# Patient Record
Sex: Male | Born: 1950
Health system: Southern US, Community
[De-identification: ages and names within clinical notes are randomized; demographics above are authoritative.]

## PROBLEM LIST (undated history)

## (undated) DIAGNOSIS — K5790 Diverticulosis of intestine, part unspecified, without perforation or abscess without bleeding: Secondary | ICD-10-CM

## (undated) DIAGNOSIS — R9439 Abnormal result of other cardiovascular function study: Secondary | ICD-10-CM

## (undated) DIAGNOSIS — R0789 Other chest pain: Secondary | ICD-10-CM

## (undated) DIAGNOSIS — E669 Obesity, unspecified: Secondary | ICD-10-CM

## (undated) DIAGNOSIS — I10 Essential (primary) hypertension: Secondary | ICD-10-CM

## (undated) DIAGNOSIS — K219 Gastro-esophageal reflux disease without esophagitis: Secondary | ICD-10-CM

## (undated) DIAGNOSIS — E785 Hyperlipidemia, unspecified: Secondary | ICD-10-CM

## (undated) DIAGNOSIS — I4510 Unspecified right bundle-branch block: Secondary | ICD-10-CM

## (undated) HISTORY — PX: PERCUTANEOUS PINNING PHALANX FRACTURE OF HAND: SUR1027

## (undated) HISTORY — DX: Other chest pain: R07.89

## (undated) HISTORY — PX: GALLBLADDER SURGERY: SHX652

## (undated) HISTORY — DX: Abnormal result of other cardiovascular function study: R94.39

## (undated) HISTORY — DX: Gastro-esophageal reflux disease without esophagitis: K21.9

## (undated) HISTORY — DX: Obesity, unspecified: E66.9

## (undated) HISTORY — DX: Hyperlipidemia, unspecified: E78.5

## (undated) HISTORY — DX: Diverticulosis of intestine, part unspecified, without perforation or abscess without bleeding: K57.90

## (undated) HISTORY — DX: Essential (primary) hypertension: I10

## (undated) HISTORY — DX: Unspecified right bundle-branch block: I45.10

---

## 1997-06-13 ENCOUNTER — Ambulatory Visit (HOSPITAL_COMMUNITY): Admission: RE | Admit: 1997-06-13 | Discharge: 1997-06-13 | Payer: Self-pay | Admitting: *Deleted

## 2001-06-26 ENCOUNTER — Emergency Department (HOSPITAL_COMMUNITY): Admission: EM | Admit: 2001-06-26 | Discharge: 2001-06-26 | Payer: Self-pay | Admitting: Emergency Medicine

## 2001-12-03 ENCOUNTER — Encounter (INDEPENDENT_AMBULATORY_CARE_PROVIDER_SITE_OTHER): Payer: Self-pay

## 2001-12-03 ENCOUNTER — Ambulatory Visit (HOSPITAL_COMMUNITY): Admission: RE | Admit: 2001-12-03 | Discharge: 2001-12-03 | Payer: Self-pay | Admitting: *Deleted

## 2003-08-28 ENCOUNTER — Ambulatory Visit (HOSPITAL_COMMUNITY): Admission: RE | Admit: 2003-08-28 | Discharge: 2003-08-28 | Payer: Self-pay | Admitting: *Deleted

## 2003-08-28 ENCOUNTER — Encounter (INDEPENDENT_AMBULATORY_CARE_PROVIDER_SITE_OTHER): Payer: Self-pay | Admitting: *Deleted

## 2005-03-11 ENCOUNTER — Emergency Department (HOSPITAL_COMMUNITY): Admission: EM | Admit: 2005-03-11 | Discharge: 2005-03-11 | Payer: Self-pay | Admitting: Emergency Medicine

## 2005-03-17 ENCOUNTER — Ambulatory Visit (HOSPITAL_COMMUNITY): Admission: RE | Admit: 2005-03-17 | Discharge: 2005-03-17 | Payer: Self-pay | Admitting: Urology

## 2007-03-01 ENCOUNTER — Ambulatory Visit (HOSPITAL_COMMUNITY): Admission: RE | Admit: 2007-03-01 | Discharge: 2007-03-01 | Payer: Self-pay | Admitting: *Deleted

## 2007-03-01 HISTORY — PX: COLONOSCOPY: SHX174

## 2008-07-23 ENCOUNTER — Encounter: Admission: RE | Admit: 2008-07-23 | Discharge: 2008-07-23 | Payer: Self-pay | Admitting: Podiatry

## 2008-09-18 ENCOUNTER — Emergency Department (HOSPITAL_COMMUNITY): Admission: EM | Admit: 2008-09-18 | Discharge: 2008-09-19 | Payer: Self-pay | Admitting: Emergency Medicine

## 2008-11-25 ENCOUNTER — Encounter: Admission: RE | Admit: 2008-11-25 | Discharge: 2008-11-25 | Payer: Self-pay | Admitting: Cardiology

## 2008-11-26 ENCOUNTER — Inpatient Hospital Stay (HOSPITAL_BASED_OUTPATIENT_CLINIC_OR_DEPARTMENT_OTHER): Admission: RE | Admit: 2008-11-26 | Discharge: 2008-11-26 | Payer: Self-pay | Admitting: Cardiology

## 2008-11-26 HISTORY — PX: CARDIAC CATHETERIZATION: SHX172

## 2009-06-13 ENCOUNTER — Ambulatory Visit (HOSPITAL_COMMUNITY): Admission: EM | Admit: 2009-06-13 | Discharge: 2009-06-13 | Payer: Self-pay | Admitting: Pediatric Emergency Medicine

## 2010-02-05 ENCOUNTER — Ambulatory Visit (INDEPENDENT_AMBULATORY_CARE_PROVIDER_SITE_OTHER): Payer: BC Managed Care – PPO | Admitting: Cardiology

## 2010-02-05 DIAGNOSIS — I119 Hypertensive heart disease without heart failure: Secondary | ICD-10-CM

## 2010-02-05 DIAGNOSIS — E7800 Pure hypercholesterolemia, unspecified: Secondary | ICD-10-CM

## 2010-02-05 DIAGNOSIS — Z79899 Other long term (current) drug therapy: Secondary | ICD-10-CM

## 2010-03-22 LAB — BASIC METABOLIC PANEL
Calcium: 9.1 mg/dL (ref 8.4–10.5)
Creatinine, Ser: 1.35 mg/dL (ref 0.4–1.5)
GFR calc Af Amer: >60 mL/min (ref >60)
Glucose, Bld: 107 mg/dL — ABNORMAL HIGH (ref 70–99)

## 2010-03-22 LAB — CBC
HCT: 44.3 % (ref 39.0–52.0)
MCV: 87.3 fL (ref 78.0–100.0)
Platelets: 243 10*3/uL (ref 150–400)
RBC: 5.08 MIL/uL (ref 4.22–5.81)
RDW: 13.7 % (ref 11.5–15.5)
WBC: 12.9 10*3/uL — ABNORMAL HIGH (ref 4.0–10.5)

## 2010-03-22 LAB — DIFFERENTIAL
Basophils Absolute: 0.1 10*3/uL (ref 0.0–0.1)
Lymphocytes Relative: 14 % (ref 12–46)
Monocytes Absolute: 0.6 10*3/uL (ref 0.1–1.0)
Monocytes Relative: 5 % (ref 3–12)
Neutrophils Relative %: 79 % — ABNORMAL HIGH (ref 43–77)

## 2010-05-18 NOTE — Op Note (Signed)
James Olson, James Olson             ACCOUNT NO.:  [id number]   MEDICAL RECORD NO.:  [id number]          PATIENT TYPE:  AMB   LOCATION:  ENDO                         FACILITY:  [facility]   PHYSICIAN:  Georgiana Spinner, M.D.    DATE OF BIRTH:  April 16, 1950   DATE OF PROCEDURE:  03/01/2007  DATE OF DISCHARGE:                               OPERATIVE REPORT   PROCEDURE PERFORMED:  Colonoscopy with polypectomy and biopsy.   INDICATIONS:  Colon polyps.   ANESTHESIA:  Fentanyl 50 mcg and Versed 5 mg.   PROCEDURE:  With the patient mildly sedated in the left lateral  decubitus position, rectal examination was performed which was  unremarkable.  The prostate felt normal to my examination.  Subsequently, the Pentax videoscopic colonoscope was inserted in the  rectum and passed under direct vision to the cecum identified by the  ileocecal valve and appendiceal orifice, both of which were  photographed.  One fold removed from the ileocecal valve was a small  polyp that was seen, photographed, and removed using snare cautery  technique.  There was a small residual polypoid tissue then removed  using hot biopsy forceps.  The polyp tissue, however, was lost in the  cecum.  I went and explored the cecum once again, but I could not find  the polypoid tissue, so I elected to withdraw the colonoscope taking  circumferential views of the colonic mucosa stopping only in the rectum  which appeared normal on direct and showed hemorrhoids on retroflexed  view. The endoscope was straightened and withdrawn.  The patient's vital  signs and pulse oximeter remained stable.  The patient tolerated the  procedure well without apparent complications.   FINDINGS:  A polyp in the ascending colon removed.  Await follow up  which will be approximately three years.           ______________________________  Georgiana Spinner, M.D.     GMO/MEDQ  D:  03/01/2007  T:  03/01/2007  Job:  [id number]

## 2010-05-21 NOTE — Op Note (Signed)
NAME:  James Olson, James Olson                       ACCOUNT NO.:  [id number]   MEDICAL RECORD NO.:  [id number]                   PATIENT TYPE:  AMB   LOCATION:  ENDO                                 FACILITY:  [facility]   PHYSICIAN:  Georgiana Spinner, M.D.                 DATE OF BIRTH:  04-Feb-1950   DATE OF PROCEDURE:  08/28/2003  DATE OF DISCHARGE:                                 OPERATIVE REPORT   PROCEDURE PERFORMED:  Upper endoscopy.   ENDOSCOPIST:  Georgiana Spinner, M.D.   INDICATIONS FOR PROCEDURE:  Gastroesophageal reflux disease.   ANESTHESIA:  Demerol 40 mg, Versed 4 mg.   DESCRIPTION OF PROCEDURE:  With the patient mildly sedated in the left  lateral decubitus position, the Olympus video endoscope was inserted in the  mouth and passed under direct vision through the esophagus which appeared  normal.  In the distal esophagus there was a ring and there was one  questionable area of Barrett's.  This was photographed and biopsied.  We  obtained tissue from the area in question and then advanced the endoscope to  the stomach.  The fundus, body, antrum, duodenal bulb and second portion of  the duodenum were unremarkable.  From this point, the endoscope was slowly  withdrawn taking circumferential views of the duodenal mucosa until the  endoscope was pulled back into the stomach and placed on retroflexion to  view the stomach from below.  The endoscope was then straightened and  withdrawn taking circumferential views of the gastric and esophageal mucosa.  The patient's vital signs and pulse oximeter remained stable.  The patient  tolerated the procedure well without apparent complications.   FINDINGS:  Question Barrett's esophagus, biopsied, await biopsy report.   PLAN:  Patient will call me for results and will follow up with me as an  outpatient.                                               Georgiana Spinner, M.D.    GMO/MEDQ  D:  08/28/2003  T:  08/28/2003  Job:  [id number]

## 2010-05-21 NOTE — Op Note (Signed)
   NAMEDENY, James Olson                         ACCOUNT NO.:  [id number]   MEDICAL RECORD NO.:  [id number]                   PATIENT TYPE:  AMB   LOCATION:  ENDO                                 FACILITY:  [facility]   PHYSICIAN:  Georgiana Spinner, M.D.                 DATE OF BIRTH:  Feb 19, 1950   DATE OF PROCEDURE:  DATE OF DISCHARGE:                                 OPERATIVE REPORT   PROCEDURE:  Upper endoscopy.   INDICATIONS:  GERD.   ANESTHESIA:  Demerol 50 mg, Versed 4 mg.   DESCRIPTION OF PROCEDURE:  With the patient mildly sedated in the left  lateral decubitus position, the Olympus videoscopic endoscope was inserted  into the mouth and passed under direct vision through the esophagus which  appeared normal until it reached the distal esophagus and there was evidence  of Barrett's seen, photographed and biopsied.  The endoscope was then  advanced into the stomach, the fundus, body, and antrum appeared normal.  The duodenal bulb showed fairly extensive erythema, it was mild to moderate  but there was edema and redness, we photographed this and then pulled back  from the second portion of the duodenum taking circumferential views of the  oral mucosa until the endoscope was pulled back into the stomach at which  point we did a biopsy for Helicobacter.  The endoscope was placed in  retroflexion to view the stomach from below and the endoscope was then  straightened and withdrawn, taking circumferential views of the remaining  gastric and esophageal mucosa.  The patient's vital signs and pulse oximetry  remained stable and the patient tolerated the procedure well without  apparent complications.   FINDINGS:  1. Barrett's esophagus.  2. Duodenitis, CLO biopsy taken.   PLAN:  Await biopsy reports.  The patient will call me for the results and  follow up with me as an outpatient.                                                Georgiana Spinner, M.D.    GMO/MEDQ  D:  12/03/2001  T:   12/03/2001  Job:  [id number]

## 2010-05-21 NOTE — Op Note (Signed)
   James Olson, James Olson                         ACCOUNT NO.:  [id number]   MEDICAL RECORD NO.:  [id number]                   PATIENT TYPE:  AMB   LOCATION:  ENDO                                 FACILITY:  [facility]   PHYSICIAN:  Georgiana Spinner, M.D.                 DATE OF BIRTH:  13-Dec-1950   DATE OF PROCEDURE:  12/03/2001  DATE OF DISCHARGE:                                 OPERATIVE REPORT   PROCEDURE:  Colonoscopy.   INDICATIONS:  Rectal bleeding, colon polyp.   ANESTHESIA:  Demerol, none further given.  Versed 1 mg.   DESCRIPTION OF PROCEDURE:  With the patient mildly sedated in the left  lateral decubitus position, the Olympus videoscopic colonoscope was inserted  in the rectum and passed under direct vision to the cecum, identified by  ileocecal valve and appendiceal orifice, both of which were photographed.  From this point the endoscope was slowly withdrawn, taking circumferential  views of the entire colonic mucosa, stopping in the descending colon at  about 80 cm from the anal verge, at which point a small polyp was seen,  photographed, and removed using hot biopsy forceps technique, setting of 20-  20 blended current.  We then pulled back all the way to the rectum, which  appeared normal on direct and showed hemorrhoid on retroflexed view.  The  endoscope was straightened and withdrawn.  The patient's vital signs and  pulse oximetry remained stable.  The patient tolerated the procedure well  and without apparent complications.   FINDINGS:  Polyp of descending colon, removed.   PLAN:  Await biopsy report.  The patient will call me for results and follow  up with me as an outpatient.                                               Georgiana Spinner, M.D.    GMO/MEDQ  D:  12/03/2001  T:  12/03/2001  Job:  [id number]

## 2011-03-29 ENCOUNTER — Encounter: Payer: Self-pay | Admitting: *Deleted

## 2012-02-11 ENCOUNTER — Emergency Department (HOSPITAL_COMMUNITY)
Admission: EM | Admit: 2012-02-11 | Discharge: 2012-02-11 | Disposition: A | Discharge status: Home / Self Care | Payer: Federal, State, Local not specified - PPO | Attending: Emergency Medicine | Admitting: Emergency Medicine

## 2012-02-11 ENCOUNTER — Encounter (HOSPITAL_COMMUNITY): Payer: Self-pay | Admitting: *Deleted

## 2012-02-11 DIAGNOSIS — K219 Gastro-esophageal reflux disease without esophagitis: Secondary | ICD-10-CM | POA: Insufficient documentation

## 2012-02-11 DIAGNOSIS — E785 Hyperlipidemia, unspecified: Secondary | ICD-10-CM | POA: Insufficient documentation

## 2012-02-11 DIAGNOSIS — M109 Gout, unspecified: Secondary | ICD-10-CM

## 2012-02-11 DIAGNOSIS — Z8719 Personal history of other diseases of the digestive system: Secondary | ICD-10-CM | POA: Insufficient documentation

## 2012-02-11 DIAGNOSIS — E669 Obesity, unspecified: Secondary | ICD-10-CM | POA: Insufficient documentation

## 2012-02-11 DIAGNOSIS — M79609 Pain in unspecified limb: Secondary | ICD-10-CM | POA: Insufficient documentation

## 2012-02-11 DIAGNOSIS — Z79899 Other long term (current) drug therapy: Secondary | ICD-10-CM | POA: Insufficient documentation

## 2012-02-11 DIAGNOSIS — Z7982 Long term (current) use of aspirin: Secondary | ICD-10-CM | POA: Insufficient documentation

## 2012-02-11 DIAGNOSIS — Z8679 Personal history of other diseases of the circulatory system: Secondary | ICD-10-CM | POA: Insufficient documentation

## 2012-02-11 DIAGNOSIS — I10 Essential (primary) hypertension: Secondary | ICD-10-CM | POA: Insufficient documentation

## 2012-02-11 DIAGNOSIS — M79676 Pain in unspecified toe(s): Secondary | ICD-10-CM

## 2012-02-11 MED ORDER — PREDNISONE 10 MG PO TABS: 20.0000 mg | ORAL_TABLET | Freq: Two times a day (BID) | ORAL | Status: DC

## 2012-02-11 NOTE — ED Provider Notes (Signed)
History     CSN: [id number]  Arrival date & time 02/11/12  1020   First MD Initiated Contact with Patient 02/11/12 1126      Chief Complaint  Patient presents with  . Toe Pain    (Consider location/radiation/quality/duration/timing/severity/associated sxs/prior treatment) Patient is a 62 y.o. male presenting with toe pain. The history is provided by the patient.  Toe Pain This is a new problem. Episode onset: 3 days ago. The problem occurs constantly. The problem has been gradually worsening. The symptoms are aggravated by walking. Nothing relieves the symptoms. He has tried nothing for the symptoms.    Past Medical History  Diagnosis Date  . Hypertension   . Hyperlipidemia   . Obesity   . RBBB   . GERD (gastroesophageal reflux disease)   . Chest pain, exertional   . Abnormal stress test     H/O  . Diverticulosis     H/O    Past Surgical History  Procedure Laterality Date  . Colonoscopy  03/01/07    W/POLYPECTOMY AND BX  . Cardiac catheterization  11/26/08    EF 55%  WITH DR. Deborah Chalk  . Percutaneous pinning phalanx fracture of hand      Family History  Problem Relation Age of Onset  . Arrhythmia Mother     PAF AND HAS PACEMAKER  . Sudden death Father 21    History  Substance Use Topics  . Smoking status: Not on file  . Smokeless tobacco: Not on file  . Alcohol Use: No      Review of Systems  All other systems reviewed and are negative.    Allergies  Ace inhibitors and Benicar  Home Medications   Current Outpatient Rx  Name  Route  Sig  Dispense  Refill  . Ascorbic Acid (VITAMIN C) 100 MG tablet   Oral   Take 100 mg by mouth daily.         Marland Kitchen aspirin EC 81 MG tablet   Oral   Take 81 mg by mouth daily.         . beta carotene w/minerals (OCUVITE) tablet   Oral   Take 1 tablet by mouth daily.         . Omega-3 Fatty Acids (FISH OIL) 1200 MG CAPS   Oral   Take 1 capsule by mouth daily.         . pravastatin (PRAVACHOL) 80 MG  tablet   Oral   Take 80 mg by mouth daily.         . RABEprazole (ACIPHEX) 20 MG tablet   Oral   Take 20 mg by mouth daily.         . Travoprost, BAK Free, (TRAVATAN) 0.004 % SOLN ophthalmic solution   Ophthalmic   Apply 1 drop to eye as directed.         . triamterene-hydrochlorothiazide (MAXZIDE-25) 37.5-25 MG per tablet   Oral   Take 1 tablet by mouth daily.           BP 138/85  Pulse 67  Temp(Src) 98.3 F (36.8 C) (Oral)  Resp 16  SpO2 99%  Physical Exam  Nursing note and vitals reviewed. Constitutional: He is oriented to person, place, and time. He appears well-developed and well-nourished. No distress.  HENT:  Head: Normocephalic and atraumatic.  Neck: Normal range of motion. Neck supple.  Musculoskeletal:  There is mild erythema around the left mtp joint.  There is pain with range of motion.  Neurological: He is alert and oriented to person, place, and time.  Skin: Skin is warm and dry. He is not diaphoretic.    ED Course  Procedures (including critical care time)  Labs Reviewed - No data to display No results found.   No diagnosis found.    MDM  Suspect gout.  Will treat with prednisone, rest.  Follow up prn.        Geoffery Lyons, MD 02/11/12 [phone]

## 2012-02-11 NOTE — ED Notes (Signed)
Pt stated that on Wednesday he started having left great toe pain. Pain is worse when he moves his to upwards. Pain is 8 out of 10 when moving. Slight swelling in great toe. Not tender to touch. No cardiac or respiratory distress. No trauma or injury to toe. Will continue to monitor.

## 2012-02-11 NOTE — ED Notes (Signed)
To ED for eval of left great toe pain. No injury noted. Painful at joint and swollen.

## 2013-11-04 DIAGNOSIS — H35362 Drusen (degenerative) of macula, left eye: Secondary | ICD-10-CM | POA: Diagnosis not present

## 2013-11-04 DIAGNOSIS — H43811 Vitreous degeneration, right eye: Secondary | ICD-10-CM | POA: Diagnosis not present

## 2013-11-04 DIAGNOSIS — H2513 Age-related nuclear cataract, bilateral: Secondary | ICD-10-CM | POA: Diagnosis not present

## 2013-11-04 DIAGNOSIS — H35361 Drusen (degenerative) of macula, right eye: Secondary | ICD-10-CM | POA: Diagnosis not present

## 2013-12-03 DIAGNOSIS — H2511 Age-related nuclear cataract, right eye: Secondary | ICD-10-CM | POA: Diagnosis not present

## 2013-12-03 DIAGNOSIS — H259 Unspecified age-related cataract: Secondary | ICD-10-CM | POA: Diagnosis not present

## 2013-12-23 DIAGNOSIS — H25012 Cortical age-related cataract, left eye: Secondary | ICD-10-CM | POA: Diagnosis not present

## 2013-12-23 DIAGNOSIS — H2512 Age-related nuclear cataract, left eye: Secondary | ICD-10-CM | POA: Diagnosis not present

## 2013-12-31 DIAGNOSIS — H2512 Age-related nuclear cataract, left eye: Secondary | ICD-10-CM | POA: Diagnosis not present

## 2014-02-18 DIAGNOSIS — E789 Disorder of lipoprotein metabolism, unspecified: Secondary | ICD-10-CM | POA: Diagnosis not present

## 2014-02-24 DIAGNOSIS — E789 Disorder of lipoprotein metabolism, unspecified: Secondary | ICD-10-CM | POA: Diagnosis not present

## 2014-02-24 DIAGNOSIS — I10 Essential (primary) hypertension: Secondary | ICD-10-CM | POA: Diagnosis not present

## 2014-02-24 DIAGNOSIS — E790 Hyperuricemia without signs of inflammatory arthritis and tophaceous disease: Secondary | ICD-10-CM | POA: Diagnosis not present

## 2014-03-11 DIAGNOSIS — R05 Cough: Secondary | ICD-10-CM | POA: Diagnosis not present

## 2014-03-11 DIAGNOSIS — E789 Disorder of lipoprotein metabolism, unspecified: Secondary | ICD-10-CM | POA: Diagnosis not present

## 2014-04-28 DIAGNOSIS — H4011X2 Primary open-angle glaucoma, moderate stage: Secondary | ICD-10-CM | POA: Diagnosis not present

## 2014-04-28 DIAGNOSIS — H35033 Hypertensive retinopathy, bilateral: Secondary | ICD-10-CM | POA: Diagnosis not present

## 2014-04-28 DIAGNOSIS — Z961 Presence of intraocular lens: Secondary | ICD-10-CM | POA: Diagnosis not present

## 2014-04-28 DIAGNOSIS — H40012 Open angle with borderline findings, low risk, left eye: Secondary | ICD-10-CM | POA: Diagnosis not present

## 2014-04-28 DIAGNOSIS — H3531 Nonexudative age-related macular degeneration: Secondary | ICD-10-CM | POA: Diagnosis not present

## 2014-04-30 DIAGNOSIS — Z23 Encounter for immunization: Secondary | ICD-10-CM | POA: Diagnosis not present

## 2014-05-23 DIAGNOSIS — Z23 Encounter for immunization: Secondary | ICD-10-CM | POA: Diagnosis not present

## 2014-07-08 DIAGNOSIS — H4011X2 Primary open-angle glaucoma, moderate stage: Secondary | ICD-10-CM | POA: Diagnosis not present

## 2014-07-08 DIAGNOSIS — H40012 Open angle with borderline findings, low risk, left eye: Secondary | ICD-10-CM | POA: Diagnosis not present

## 2014-07-11 DIAGNOSIS — M25569 Pain in unspecified knee: Secondary | ICD-10-CM | POA: Diagnosis not present

## 2014-07-11 DIAGNOSIS — E790 Hyperuricemia without signs of inflammatory arthritis and tophaceous disease: Secondary | ICD-10-CM | POA: Diagnosis not present

## 2014-07-11 DIAGNOSIS — E789 Disorder of lipoprotein metabolism, unspecified: Secondary | ICD-10-CM | POA: Diagnosis not present

## 2014-07-14 DIAGNOSIS — M25561 Pain in right knee: Secondary | ICD-10-CM | POA: Diagnosis not present

## 2014-07-14 DIAGNOSIS — E790 Hyperuricemia without signs of inflammatory arthritis and tophaceous disease: Secondary | ICD-10-CM | POA: Diagnosis not present

## 2014-07-18 DIAGNOSIS — S83207A Unspecified tear of unspecified meniscus, current injury, left knee, initial encounter: Secondary | ICD-10-CM | POA: Diagnosis not present

## 2014-08-20 DIAGNOSIS — M25571 Pain in right ankle and joints of right foot: Secondary | ICD-10-CM | POA: Diagnosis not present

## 2014-08-20 DIAGNOSIS — M79671 Pain in right foot: Secondary | ICD-10-CM | POA: Diagnosis not present

## 2014-08-20 DIAGNOSIS — G8929 Other chronic pain: Secondary | ICD-10-CM | POA: Diagnosis not present

## 2014-08-20 DIAGNOSIS — M12571 Traumatic arthropathy, right ankle and foot: Secondary | ICD-10-CM | POA: Diagnosis not present

## 2014-08-25 DIAGNOSIS — Z125 Encounter for screening for malignant neoplasm of prostate: Secondary | ICD-10-CM | POA: Diagnosis not present

## 2014-08-25 DIAGNOSIS — I10 Essential (primary) hypertension: Secondary | ICD-10-CM | POA: Diagnosis not present

## 2014-08-25 DIAGNOSIS — E789 Disorder of lipoprotein metabolism, unspecified: Secondary | ICD-10-CM | POA: Diagnosis not present

## 2014-08-25 DIAGNOSIS — R5383 Other fatigue: Secondary | ICD-10-CM | POA: Diagnosis not present

## 2014-09-02 DIAGNOSIS — E789 Disorder of lipoprotein metabolism, unspecified: Secondary | ICD-10-CM | POA: Diagnosis not present

## 2014-09-02 DIAGNOSIS — E790 Hyperuricemia without signs of inflammatory arthritis and tophaceous disease: Secondary | ICD-10-CM | POA: Diagnosis not present

## 2014-09-02 DIAGNOSIS — I10 Essential (primary) hypertension: Secondary | ICD-10-CM | POA: Diagnosis not present

## 2014-09-02 DIAGNOSIS — M25569 Pain in unspecified knee: Secondary | ICD-10-CM | POA: Diagnosis not present

## 2014-12-15 DIAGNOSIS — H401112 Primary open-angle glaucoma, right eye, moderate stage: Secondary | ICD-10-CM | POA: Diagnosis not present

## 2014-12-15 DIAGNOSIS — H40012 Open angle with borderline findings, low risk, left eye: Secondary | ICD-10-CM | POA: Diagnosis not present

## 2015-03-30 DIAGNOSIS — J3489 Other specified disorders of nose and nasal sinuses: Secondary | ICD-10-CM | POA: Diagnosis not present

## 2015-03-30 DIAGNOSIS — H6120 Impacted cerumen, unspecified ear: Secondary | ICD-10-CM | POA: Diagnosis not present

## 2015-04-01 DIAGNOSIS — R1031 Right lower quadrant pain: Secondary | ICD-10-CM | POA: Diagnosis not present

## 2015-04-01 DIAGNOSIS — Z0000 Encounter for general adult medical examination without abnormal findings: Secondary | ICD-10-CM | POA: Diagnosis not present

## 2015-04-08 DIAGNOSIS — E789 Disorder of lipoprotein metabolism, unspecified: Secondary | ICD-10-CM | POA: Diagnosis not present

## 2015-04-08 DIAGNOSIS — J329 Chronic sinusitis, unspecified: Secondary | ICD-10-CM | POA: Diagnosis not present

## 2015-04-08 DIAGNOSIS — N390 Urinary tract infection, site not specified: Secondary | ICD-10-CM | POA: Diagnosis not present

## 2015-04-08 DIAGNOSIS — I10 Essential (primary) hypertension: Secondary | ICD-10-CM | POA: Diagnosis not present

## 2015-05-07 DIAGNOSIS — H353122 Nonexudative age-related macular degeneration, left eye, intermediate dry stage: Secondary | ICD-10-CM | POA: Diagnosis not present

## 2015-05-07 DIAGNOSIS — H401112 Primary open-angle glaucoma, right eye, moderate stage: Secondary | ICD-10-CM | POA: Diagnosis not present

## 2015-05-07 DIAGNOSIS — H26491 Other secondary cataract, right eye: Secondary | ICD-10-CM | POA: Diagnosis not present

## 2015-05-07 DIAGNOSIS — H35371 Puckering of macula, right eye: Secondary | ICD-10-CM | POA: Diagnosis not present

## 2015-05-07 DIAGNOSIS — H353112 Nonexudative age-related macular degeneration, right eye, intermediate dry stage: Secondary | ICD-10-CM | POA: Diagnosis not present

## 2015-05-07 DIAGNOSIS — H40012 Open angle with borderline findings, low risk, left eye: Secondary | ICD-10-CM | POA: Diagnosis not present

## 2015-06-24 DIAGNOSIS — M25571 Pain in right ankle and joints of right foot: Secondary | ICD-10-CM | POA: Diagnosis not present

## 2015-06-24 DIAGNOSIS — M79673 Pain in unspecified foot: Secondary | ICD-10-CM | POA: Diagnosis not present

## 2015-06-26 DIAGNOSIS — M79672 Pain in left foot: Secondary | ICD-10-CM | POA: Diagnosis not present

## 2015-06-26 DIAGNOSIS — S92515A Nondisplaced fracture of proximal phalanx of left lesser toe(s), initial encounter for closed fracture: Secondary | ICD-10-CM | POA: Diagnosis not present

## 2015-08-27 DIAGNOSIS — E789 Disorder of lipoprotein metabolism, unspecified: Secondary | ICD-10-CM | POA: Diagnosis not present

## 2015-08-27 DIAGNOSIS — I10 Essential (primary) hypertension: Secondary | ICD-10-CM | POA: Diagnosis not present

## 2015-08-27 DIAGNOSIS — Z125 Encounter for screening for malignant neoplasm of prostate: Secondary | ICD-10-CM | POA: Diagnosis not present

## 2015-08-27 DIAGNOSIS — Z0000 Encounter for general adult medical examination without abnormal findings: Secondary | ICD-10-CM | POA: Diagnosis not present

## 2015-09-02 DIAGNOSIS — L309 Dermatitis, unspecified: Secondary | ICD-10-CM | POA: Diagnosis not present

## 2015-09-02 DIAGNOSIS — I10 Essential (primary) hypertension: Secondary | ICD-10-CM | POA: Diagnosis not present

## 2015-09-02 DIAGNOSIS — K210 Gastro-esophageal reflux disease with esophagitis: Secondary | ICD-10-CM | POA: Diagnosis not present

## 2015-09-02 DIAGNOSIS — Z23 Encounter for immunization: Secondary | ICD-10-CM | POA: Diagnosis not present

## 2015-09-28 DIAGNOSIS — H401112 Primary open-angle glaucoma, right eye, moderate stage: Secondary | ICD-10-CM | POA: Diagnosis not present

## 2015-09-28 DIAGNOSIS — H40012 Open angle with borderline findings, low risk, left eye: Secondary | ICD-10-CM | POA: Diagnosis not present

## 2015-09-28 DIAGNOSIS — H04123 Dry eye syndrome of bilateral lacrimal glands: Secondary | ICD-10-CM | POA: Diagnosis not present

## 2015-11-12 DIAGNOSIS — Z1211 Encounter for screening for malignant neoplasm of colon: Secondary | ICD-10-CM | POA: Diagnosis not present

## 2015-11-12 DIAGNOSIS — Z1212 Encounter for screening for malignant neoplasm of rectum: Secondary | ICD-10-CM | POA: Diagnosis not present

## 2015-12-15 DIAGNOSIS — K635 Polyp of colon: Secondary | ICD-10-CM | POA: Diagnosis not present

## 2015-12-15 DIAGNOSIS — R195 Other fecal abnormalities: Secondary | ICD-10-CM | POA: Diagnosis not present

## 2015-12-15 DIAGNOSIS — Z86010 Personal history of colonic polyps: Secondary | ICD-10-CM | POA: Diagnosis not present

## 2015-12-15 DIAGNOSIS — K5730 Diverticulosis of large intestine without perforation or abscess without bleeding: Secondary | ICD-10-CM | POA: Diagnosis not present

## 2015-12-15 DIAGNOSIS — D120 Benign neoplasm of cecum: Secondary | ICD-10-CM | POA: Diagnosis not present

## 2015-12-15 DIAGNOSIS — D124 Benign neoplasm of descending colon: Secondary | ICD-10-CM | POA: Diagnosis not present

## 2015-12-25 ENCOUNTER — Other Ambulatory Visit: Payer: Self-pay

## 2015-12-25 ENCOUNTER — Emergency Department (HOSPITAL_COMMUNITY): Payer: Medicare Other

## 2015-12-25 ENCOUNTER — Encounter (HOSPITAL_COMMUNITY): Payer: Self-pay

## 2015-12-25 ENCOUNTER — Observation Stay (HOSPITAL_COMMUNITY)
Admission: EM | Admit: 2015-12-25 | Discharge: 2015-12-26 | Disposition: A | Discharge status: Home / Self Care | Payer: Medicare Other | Attending: Internal Medicine | Admitting: Internal Medicine

## 2015-12-25 DIAGNOSIS — R0789 Other chest pain: Principal | ICD-10-CM | POA: Insufficient documentation

## 2015-12-25 DIAGNOSIS — Z7982 Long term (current) use of aspirin: Secondary | ICD-10-CM | POA: Insufficient documentation

## 2015-12-25 DIAGNOSIS — Z79899 Other long term (current) drug therapy: Secondary | ICD-10-CM | POA: Insufficient documentation

## 2015-12-25 DIAGNOSIS — I10 Essential (primary) hypertension: Secondary | ICD-10-CM | POA: Insufficient documentation

## 2015-12-25 DIAGNOSIS — K219 Gastro-esophageal reflux disease without esophagitis: Secondary | ICD-10-CM | POA: Diagnosis present

## 2015-12-25 DIAGNOSIS — E876 Hypokalemia: Secondary | ICD-10-CM | POA: Diagnosis not present

## 2015-12-25 DIAGNOSIS — R079 Chest pain, unspecified: Secondary | ICD-10-CM

## 2015-12-25 DIAGNOSIS — E785 Hyperlipidemia, unspecified: Secondary | ICD-10-CM | POA: Diagnosis present

## 2015-12-25 DIAGNOSIS — R0602 Shortness of breath: Secondary | ICD-10-CM | POA: Diagnosis not present

## 2015-12-25 LAB — CBC WITH DIFFERENTIAL/PLATELET
BASOS PCT: 0 %
Basophils Absolute: 0 10*3/uL (ref 0.0–0.1)
Eosinophils Absolute: 0.2 10*3/uL (ref 0.0–0.7)
Eosinophils Relative: 3 %
HEMATOCRIT: 37.4 % — AB (ref 39.0–52.0)
HEMOGLOBIN: 13.1 g/dL (ref 13.0–17.0)
Lymphocytes Relative: 21 %
Lymphs Abs: 1.9 10*3/uL (ref 0.7–4.0)
MCH: 29.4 pg (ref 26.0–34.0)
MCHC: 35 g/dL (ref 30.0–36.0)
MCV: 83.9 fL (ref 78.0–100.0)
MONOS PCT: 6 %
Monocytes Absolute: 0.5 10*3/uL (ref 0.1–1.0)
NEUTROS ABS: 6.3 10*3/uL (ref 1.7–7.7)
NEUTROS PCT: 70 %
Platelets: 233 10*3/uL (ref 150–400)
RBC: 4.46 MIL/uL (ref 4.22–5.81)
RDW: 13.9 % (ref 11.5–15.5)
WBC: 9 10*3/uL (ref 4.0–10.5)

## 2015-12-25 LAB — BASIC METABOLIC PANEL
ANION GAP: 10 (ref 5–15)
BUN: 12 mg/dL (ref 6–20)
CALCIUM: 7.8 mg/dL — AB (ref 8.9–10.3)
CO2: 25 mmol/L (ref 22–32)
Chloride: 107 mmol/L (ref 101–111)
Creatinine, Ser: 1.05 mg/dL (ref 0.61–1.24)
Glucose, Bld: 137 mg/dL — ABNORMAL HIGH (ref 65–99)
POTASSIUM: 3.1 mmol/L — AB (ref 3.5–5.1)
SODIUM: 142 mmol/L (ref 135–145)

## 2015-12-25 LAB — I-STAT TROPONIN, ED: Troponin i, poc: 0 ng/mL (ref 0.00–0.08)

## 2015-12-25 MED ORDER — NITROGLYCERIN 0.4 MG SL SUBL
0.4000 mg | SUBLINGUAL_TABLET | Freq: Once | SUBLINGUAL | Status: AC
  Administered 2015-12-25: 0.4 mg via SUBLINGUAL
  Filled 2015-12-25: qty 1

## 2015-12-25 MED ORDER — GI COCKTAIL ~~LOC~~
30.0000 mL | Freq: Once | ORAL | Status: AC
  Administered 2015-12-25: 30 mL via ORAL
  Filled 2015-12-25: qty 30

## 2015-12-25 MED ORDER — NITROGLYCERIN 0.4 MG SL SUBL
0.4000 mg | SUBLINGUAL_TABLET | SUBLINGUAL | Status: AC | PRN
  Administered 2015-12-25 (×2): 0.4 mg via SUBLINGUAL

## 2015-12-25 NOTE — ED Notes (Signed)
Patient transported to X-ray 

## 2015-12-25 NOTE — ED Provider Notes (Signed)
[facility] DEPT Provider Note   CSN: [id number] Arrival date & time: 12/25/15  2143     History   Chief Complaint Chief Complaint  Patient presents with  . Chest Pain    HPI James Olson is a 65 y.o. male.  HPI 65 year old male who presents with chest pain. He has a history of hypertension, hyperlipidemia, and obesity. States onset of chest pain prior to arrival while he was driving his car. Reports chest heaviness and tightness with tingling in bilateral arms and legs. Has not had chest pain similar to this in the past. States that he did have a heavy meal prior to driving. Denies any abdominal pain, nausea or vomiting. Symptoms also associated with mild lightheadedness and shortness of breath. No leg swelling or leg pain, recent immobilization. Received nitro en route. Unable to receive aspirin per patient due to recent colonoscopy.  Past Medical History:  Diagnosis Date  . Abnormal stress test    H/O  . Chest pain, exertional   . Diverticulosis    H/O  . GERD (gastroesophageal reflux disease)   . Hyperlipidemia   . Hypertension   . Obesity   . RBBB     Patient Active Problem List   Diagnosis Date Noted  . Chest pain 12/26/2015    Past Surgical History:  Procedure Laterality Date  . CARDIAC CATHETERIZATION  11/26/08   EF 55%  WITH DR. Doreatha Lew  . COLONOSCOPY  03/01/07   W/POLYPECTOMY AND BX  . PERCUTANEOUS PINNING PHALANX FRACTURE OF HAND         Home Medications    Prior to Admission medications   Medication Sig Start Date End Date Taking? Authorizing Provider  allopurinol (ZYLOPRIM) 300 MG tablet Take 300 mg by mouth daily.   Yes Historical Provider, MD  colchicine 0.6 MG tablet Take 0.6 mg by mouth 2 (two) times daily.   Yes Historical Provider, MD  dorzolamide (TRUSOPT) 2 % ophthalmic solution Place 1 drop into the right eye 2 (two) times daily.   Yes Historical Provider, MD  ezetimibe (ZETIA) 10 MG tablet Take 10 mg by mouth daily.   Yes  Historical Provider, MD  fluocinonide cream (LIDEX) [id number] % Apply 1 application topically 2 (two) times daily as needed (dry skin).    Yes Historical Provider, MD  lisinopril (PRINIVIL,ZESTRIL) 20 MG tablet Take 20 mg by mouth every evening.   Yes Historical Provider, MD  omeprazole (PRILOSEC) 40 MG capsule Take 40 mg by mouth daily.   Yes Historical Provider, MD  OVER THE COUNTER MEDICATION Take 1 tablet by mouth daily. Macular Degeneration Supplement   Yes Historical Provider, MD  simvastatin (ZOCOR) 40 MG tablet Take 40 mg by mouth every evening.   Yes Historical Provider, MD  aspirin EC 81 MG tablet Take 81 mg by mouth daily.    Historical Provider, MD    Family History Family History  Problem Relation Age of Onset  . Arrhythmia Mother     PAF AND HAS PACEMAKER  . Sudden death Father 27    Social History Social History  Substance Use Topics  . Smoking status: Never Smoker  . Smokeless tobacco: Never Used  . Alcohol use No     Allergies   Ace inhibitors; Aspirin; Benicar [olmesartan medoxomil]; and Other   Review of Systems Review of Systems 10/14 systems reviewed and are negative other than those stated in the HPI   Physical Exam Updated Vital Signs BP 147/84   Pulse 76  Temp 98.2 F (36.8 C) (Oral)   Resp 14   Ht 5\' 8"  (1.727 m)   Wt 190 lb (86.2 kg)   SpO2 96%   BMI 28.89 kg/m   Physical Exam Physical Exam  Nursing note and vitals reviewed. Constitutional: Well developed, well nourished, non-toxic, and in no acute distress Head: Normocephalic and atraumatic.  Mouth/Throat: Oropharynx is clear and moist.  Neck: Normal range of motion. Neck supple.  Cardiovascular: Normal rate and regular rhythm.   Pulmonary/Chest: Effort normal and breath sounds normal.  Abdominal: Soft. There is no tenderness. There is no rebound and no guarding.  Musculoskeletal: Normal range of motion.  Neurological: Alert, no facial droop, fluent speech, moves all extremities  symmetrically, sensation to light touch intact throughout Skin: Skin is warm and dry.  Psychiatric: Cooperative   ED Treatments / Results  Labs (all labs ordered are listed, but only abnormal results are displayed) Labs Reviewed  CBC WITH DIFFERENTIAL/PLATELET - Abnormal; Notable for the following:       Result Value   HCT 37.4 (*)    All other components within normal limits  BASIC METABOLIC PANEL - Abnormal; Notable for the following:    Potassium 3.1 (*)    Glucose, Bld 137 (*)    Calcium 7.8 (*)    All other components within normal limits  I-STAT TROPOININ, ED    EKG  EKG Interpretation None       Radiology Dg Chest 2 View  Result Date: 12/25/2015 CLINICAL DATA:  Chest tightness and shortness of breath EXAM: CHEST  2 VIEW COMPARISON:  Chest radiograph 07/11/2014 FINDINGS: Cardiomediastinal contours are normal. No pleural effusion or pneumothorax. No focal airspace consolidation or pulmonary edema. IMPRESSION: No active cardiopulmonary disease. Electronically Signed   By: Ulyses Jarred M.D.   On: 12/25/2015 23:30    Procedures Procedures (including critical care time)  Medications Ordered in ED Medications  nitroGLYCERIN (NITROSTAT) SL tablet 0.4 mg (0.4 mg Sublingual Given 12/25/15 2232)  nitroGLYCERIN (NITROSTAT) SL tablet 0.4 mg (0.4 mg Sublingual Given 12/25/15 2344)  gi cocktail (Maalox,Lidocaine,Donnatal) (30 mLs Oral Given 12/25/15 2343)     Initial Impression / Assessment and Plan / ED Course  I have reviewed the triage vital signs and the nursing notes.  Pertinent labs & imaging results that were available during my care of the patient were reviewed by me and considered in my medical decision making (see chart for details).  Clinical Course     65 year old male who presents with chest pain prior to arrival. Does have ongoing chest pressure and given nitroglycerin. Vital signs stable. Exam overall nonfocal. EKG visualized, showing right bundle branch  block and left anterior fascicular block without acute ischemic changes.Troponin 1 is negative. Chest x-ray visualized shows no acute cardiopulmonary processes. Concern for ACS, given many risk factors and Heart score of 5.  We'll plan to admit for ACS rule out. At this time symptoms not concerning for dissection, PE, or other acute cardiopulmonary processes. Discussed with Dr. Tamala Julian from hospitalist service, who will admit for observation.  Final Clinical Impressions(s) / ED Diagnoses   Final diagnoses:  Nonspecific chest pain    New Prescriptions New Prescriptions   No medications on file     Forde Dandy, MD 12/26/15 0025

## 2015-12-25 NOTE — ED Notes (Signed)
ED Provider at bedside. 

## 2015-12-25 NOTE — ED Triage Notes (Signed)
Pt BIB [facility]. Pt called EMS for a CP that he felt while driving. Pt described to EMS as heaviness, tightness, with numbness and tingling in both arms. EMS states negative stroke screen. Withheld aspirin r/t colonoscopy that pt had 12-15-15. Pt reports he typically takes a baby aspirin however his Dr. Rockey Situ him to not take aspirin 1 week prior and 2 weeks after. Reports anxiety and SOB in truck so EMS placed on 2L. Pt had 2 Nitro in route. RBBB per EMS.

## 2015-12-26 ENCOUNTER — Observation Stay (HOSPITAL_BASED_OUTPATIENT_CLINIC_OR_DEPARTMENT_OTHER): Payer: Medicare Other

## 2015-12-26 DIAGNOSIS — K219 Gastro-esophageal reflux disease without esophagitis: Secondary | ICD-10-CM

## 2015-12-26 DIAGNOSIS — R079 Chest pain, unspecified: Secondary | ICD-10-CM | POA: Diagnosis not present

## 2015-12-26 DIAGNOSIS — I10 Essential (primary) hypertension: Secondary | ICD-10-CM | POA: Diagnosis not present

## 2015-12-26 DIAGNOSIS — R0789 Other chest pain: Secondary | ICD-10-CM | POA: Diagnosis not present

## 2015-12-26 DIAGNOSIS — I208 Other forms of angina pectoris: Secondary | ICD-10-CM

## 2015-12-26 DIAGNOSIS — E876 Hypokalemia: Secondary | ICD-10-CM

## 2015-12-26 DIAGNOSIS — E785 Hyperlipidemia, unspecified: Secondary | ICD-10-CM

## 2015-12-26 LAB — LIPID PANEL
CHOL/HDL RATIO: 2.8 ratio
Cholesterol: 111 mg/dL (ref 0–200)
HDL: 39 mg/dL — AB (ref >40)
LDL CALC: 54 mg/dL (ref 0–99)
TRIGLYCERIDES: 91 mg/dL (ref <150)
VLDL: 18 mg/dL (ref 0–40)

## 2015-12-26 LAB — BASIC METABOLIC PANEL
Anion gap: 9 (ref 5–15)
BUN: 11 mg/dL (ref 6–20)
CHLORIDE: 108 mmol/L (ref 101–111)
CO2: 25 mmol/L (ref 22–32)
CREATININE: 0.86 mg/dL (ref 0.61–1.24)
Calcium: 8.1 mg/dL — ABNORMAL LOW (ref 8.9–10.3)
GFR calc Af Amer: >60 mL/min (ref >60)
GFR calc non Af Amer: >60 mL/min (ref >60)
GLUCOSE: 100 mg/dL — AB (ref 65–99)
Potassium: 3.5 mmol/L (ref 3.5–5.1)
SODIUM: 142 mmol/L (ref 135–145)

## 2015-12-26 LAB — TROPONIN I: Troponin I: <0.03 ng/mL (ref <0.03)

## 2015-12-26 LAB — MRSA PCR SCREENING: MRSA BY PCR: NEGATIVE

## 2015-12-26 LAB — NM MYOCAR MULTI W/SPECT W/WALL MOTION / EF
CHL RATE OF PERCEIVED EXERTION: 0
CSEPED: 0 min
CSEPEW: 1 METS
CSEPHR: 56 %
Exercise duration (sec): 0 s
MPHR: 153 {beats}/min
Peak HR: 86 {beats}/min
Rest HR: 54 {beats}/min

## 2015-12-26 LAB — MAGNESIUM
MAGNESIUM: 2.2 mg/dL (ref 1.7–2.4)
Magnesium: 0.8 mg/dL — CL (ref 1.7–2.4)

## 2015-12-26 LAB — D-DIMER, QUANTITATIVE: D-Dimer, Quant: 0.28 ug/mL-FEU (ref 0.00–0.50)

## 2015-12-26 MED ORDER — EZETIMIBE 10 MG PO TABS
10.0000 mg | ORAL_TABLET | Freq: Every day | ORAL | Status: DC
  Administered 2015-12-26: 10 mg via ORAL
  Filled 2015-12-26: qty 1

## 2015-12-26 MED ORDER — TECHNETIUM TC 99M TETROFOSMIN IV KIT
30.0000 | PACK | Freq: Once | INTRAVENOUS | Status: AC | PRN
  Administered 2015-12-26: 30 via INTRAVENOUS

## 2015-12-26 MED ORDER — PANTOPRAZOLE SODIUM 40 MG PO TBEC
40.0000 mg | DELAYED_RELEASE_TABLET | Freq: Every day | ORAL | Status: DC
  Administered 2015-12-26: 40 mg via ORAL
  Filled 2015-12-26: qty 1

## 2015-12-26 MED ORDER — SODIUM CHLORIDE 0.9 % IV SOLN
1.0000 g | Freq: Once | INTRAVENOUS | Status: AC
  Administered 2015-12-26: 1 g via INTRAVENOUS
  Filled 2015-12-26: qty 10

## 2015-12-26 MED ORDER — ALLOPURINOL 100 MG PO TABS
300.0000 mg | ORAL_TABLET | Freq: Every day | ORAL | Status: DC
  Administered 2015-12-26: 300 mg via ORAL
  Filled 2015-12-26: qty 3

## 2015-12-26 MED ORDER — ASPIRIN EC 81 MG PO TBEC
81.0000 mg | DELAYED_RELEASE_TABLET | Freq: Every day | ORAL | Status: DC
Start: 2015-12-26 — End: 2015-12-26
  Administered 2015-12-26: 81 mg via ORAL
  Filled 2015-12-26: qty 1

## 2015-12-26 MED ORDER — ONDANSETRON HCL 4 MG/2ML IJ SOLN: 4.0000 mg | Freq: Four times a day (QID) | INTRAMUSCULAR | Status: DC | PRN

## 2015-12-26 MED ORDER — COLCHICINE 0.6 MG PO TABS
0.6000 mg | ORAL_TABLET | Freq: Two times a day (BID) | ORAL | Status: DC
  Administered 2015-12-26: 0.6 mg via ORAL
  Filled 2015-12-26: qty 1

## 2015-12-26 MED ORDER — POTASSIUM CHLORIDE CRYS ER 20 MEQ PO TBCR
40.0000 meq | EXTENDED_RELEASE_TABLET | ORAL | Status: AC
  Administered 2015-12-26: 40 meq via ORAL
  Filled 2015-12-26: qty 2

## 2015-12-26 MED ORDER — MAGNESIUM OXIDE -MG SUPPLEMENT 400 (240 MG) MG PO TABS: 1.0000 | ORAL_TABLET | Freq: Every day | ORAL | 1 refills | Status: DC

## 2015-12-26 MED ORDER — DORZOLAMIDE HCL 2 % OP SOLN
1.0000 [drp] | Freq: Two times a day (BID) | OPHTHALMIC | Status: DC
  Administered 2015-12-26: 1 [drp] via OPHTHALMIC
  Filled 2015-12-26: qty 10

## 2015-12-26 MED ORDER — GI COCKTAIL ~~LOC~~: 30.0000 mL | Freq: Four times a day (QID) | ORAL | Status: DC | PRN

## 2015-12-26 MED ORDER — LISINOPRIL 20 MG PO TABS: 20.0000 mg | ORAL_TABLET | Freq: Every evening | ORAL | Status: DC

## 2015-12-26 MED ORDER — FLUOCINONIDE 0.05 % EX CREA
1.0000 "application " | TOPICAL_CREAM | Freq: Two times a day (BID) | CUTANEOUS | Status: DC | PRN
  Filled 2015-12-26: qty 30

## 2015-12-26 MED ORDER — REGADENOSON 0.4 MG/5ML IV SOLN
INTRAVENOUS | Status: AC
  Filled 2015-12-26: qty 5

## 2015-12-26 MED ORDER — TECHNETIUM TC 99M TETROFOSMIN IV KIT
10.0000 | PACK | Freq: Once | INTRAVENOUS | Status: AC | PRN
  Administered 2015-12-26: 10 via INTRAVENOUS

## 2015-12-26 MED ORDER — SIMVASTATIN 40 MG PO TABS: 40.0000 mg | ORAL_TABLET | Freq: Every evening | ORAL | Status: DC

## 2015-12-26 MED ORDER — MORPHINE SULFATE (PF) 4 MG/ML IV SOLN: 2.0000 mg | INTRAVENOUS | Status: DC | PRN

## 2015-12-26 MED ORDER — REGADENOSON 0.4 MG/5ML IV SOLN
0.4000 mg | Freq: Once | INTRAVENOUS | Status: AC
  Administered 2015-12-26: 0.4 mg via INTRAVENOUS
  Filled 2015-12-26: qty 5

## 2015-12-26 MED ORDER — MAGNESIUM SULFATE 4 GM/100ML IV SOLN
4.0000 g | Freq: Once | INTRAVENOUS | Status: AC
  Administered 2015-12-26: 4 g via INTRAVENOUS
  Filled 2015-12-26: qty 100

## 2015-12-26 MED ORDER — ACETAMINOPHEN 325 MG PO TABS: 650.0000 mg | ORAL_TABLET | ORAL | Status: DC | PRN

## 2015-12-26 MED ORDER — ENOXAPARIN SODIUM 40 MG/0.4ML ~~LOC~~ SOLN
40.0000 mg | SUBCUTANEOUS | Status: DC
  Administered 2015-12-26: 40 mg via SUBCUTANEOUS
  Filled 2015-12-26: qty 0.4

## 2015-12-26 NOTE — ED Notes (Signed)
Report attempted 

## 2015-12-26 NOTE — Progress Notes (Signed)
lexiscan portion completed without complications nuc results to follow  

## 2015-12-26 NOTE — Progress Notes (Signed)
Pt discharged to home via w/c, accompanied by family member, condition stable, d/c education complete.  Edward Qualia RN

## 2015-12-26 NOTE — Discharge Instructions (Signed)
Chest Wall Pain °Introduction °Chest wall pain is pain in or around the bones and muscles of your chest. Sometimes, an injury causes this pain. Sometimes, the cause may not be known. This pain may take several weeks or longer to get better. °Follow these instructions at home: °Pay attention to any changes in your symptoms. Take these actions to help with your pain: °· Rest as told by your doctor. °· Avoid activities that cause pain. Try not to use your chest, belly (abdominal), or side muscles to lift heavy things. °· If directed, apply ice to the painful area: °¨ Put ice in a plastic bag. °¨ Place a towel between your skin and the bag. °¨ Leave the ice on for 20 minutes, 2-3 times per day. °· Take over-the-counter and prescription medicines only as told by your doctor. °· Do not use tobacco products, including cigarettes, chewing tobacco, and e-cigarettes. If you need help quitting, ask your doctor. °· Keep all follow-up visits as told by your doctor. This is important. °Contact a doctor if: °· You have a fever. °· Your chest pain gets worse. °· You have new symptoms. °Get help right away if: °· You feel sick to your stomach (nauseous) or you throw up (vomit). °· You feel sweaty or light-headed. °· You have a cough with phlegm (sputum) or you cough up blood. °· You are short of breath. °This information is not intended to replace advice given to you by your health care provider. Make sure you discuss any questions you have with your health care provider. °Document Released: 06/08/2007 Document Revised: 05/28/2015 Document Reviewed: 03/17/2014 °© 2017 [employer] ° °

## 2015-12-26 NOTE — Discharge Summary (Signed)
Physician Discharge Summary   Patient ID: HAM CONK MRN: [id number] DOB/AGE: 67-Feb-1952 67 y.o.  Admit date: 12/25/2015 Discharge date: 12/26/2015  Primary Care Physician:  Dwan Bolt, MD  Discharge Diagnoses:    . Chest pain . severe Hypomagnesemia . Hypokalemia . Dyslipidemia . Essential hypertension . GERD (gastroesophageal reflux disease)   Consults: Cardiology, Dr. Rayann Heman  Recommendations for Outpatient Follow-up:  1. Please note magnesium level was severely low, 0.8 on admission. Patient was given high dose of magnesium 4 g IV. Improved to 2.2 at the time of discharge. He is also placed on oral magnesium 400 mg daily. Potassium was also low 3.1 at the time of admission. 2. Please repeat CBC/BMET, magnesium at follow-up visit in 7-10 days   DIET: Heart healthy diet    Allergies:   Allergies  Allergen Reactions  . Ace Inhibitors Cough  . Aspirin Other (See Comments)    Can take enteric coated but not the regular. GI issues  . Benicar [Olmesartan Medoxomil] Cough  . Other     Preservative in some eye drops. Causes eyes to blister.      DISCHARGE MEDICATIONS: Current Discharge Medication List    START taking these medications   Details  Magnesium Oxide 400 (240 Mg) MG TABS Take 1 tablet by mouth daily. Qty: 30 tablet, Refills: 1      CONTINUE these medications which have NOT CHANGED   Details  allopurinol (ZYLOPRIM) 300 MG tablet Take 300 mg by mouth daily.    colchicine 0.6 MG tablet Take 0.6 mg by mouth 2 (two) times daily.    dorzolamide (TRUSOPT) 2 % ophthalmic solution Place 1 drop into the right eye 2 (two) times daily.    ezetimibe (ZETIA) 10 MG tablet Take 10 mg by mouth daily.    fluocinonide cream (LIDEX) [id number] % Apply 1 application topically 2 (two) times daily as needed (dry skin).     lisinopril (PRINIVIL,ZESTRIL) 20 MG tablet Take 20 mg by mouth every evening.    omeprazole (PRILOSEC) 40 MG capsule Take 40 mg by  mouth daily.    OVER THE COUNTER MEDICATION Take 1 tablet by mouth daily. Macular Degeneration Supplement    simvastatin (ZOCOR) 40 MG tablet Take 40 mg by mouth every evening.    aspirin EC 81 MG tablet Take 81 mg by mouth daily.         Brief H and P: For complete details please refer to admission H and P, but in brief James Olson is a 65 y.o. male with medical history significant of HTN, HLD, CAD, obesity, and gout; presents with complaints of chest pain. Patient states that last night while driving home after eating a heavy meal in [place] he had acute onset of severe chest heaviness. Rates pain as 8 out of 10 pain scale. Associated symptoms included bilateral hand numbness/stiffness, lightheadedness, and found it harder to breathe. Denies having any nausea, vomiting, leg swelling/pain, recent trips/immobilization, diarrhea, fever, or chills. He is able to pull over to the side of the road and call 911. He states that he's had similar like pain in the past approximately 7 years ago for which he was admitted and underwent stress testing and cardiac catheterization. Review of records shows that patient had ejection fraction of 73% with signs of inf wall ischemia. Patient underwent cardiac catheterization which showed mild two-vessel atherosclerosis.   Hospital Course:   Chest pain: Acute. Heart score =4. Patient has previous history of cardiac cath back in 2010  at which time he was noted to have mild 2 vessel disease and EF of 75%. The patient was admitted for chest pain rule out. Serial cardiac enzymes were negative for ACS. D-dimer 0.28 Lipid panel showed LDL 54 Cardiology was consulted, recommended nuclear medicine stress test Stress that showed 1. Large fixed defect in the inferior wall, apex, and adjacent lateral and septal regions.  2. Normal left ventricular wall motion.  3. Left ventricular ejection fraction 59%  4. Non invasive risk stratification*: Low  risk. Patient denied any prior history of CAD. This was discussed with Dr. Rayann Heman, recommended okay to discharge home and will have outpatient follow-up with cardiology office. Patient will continue aspirin, ACE inhibitor, statin  Electrolyte disturbances (Hypokalemia and severe hypomagnesemia): As seen on initial lab work. Patient reports some feeling of numbness/stiffness in his hands question if this is Secondary to electrolyte disturbances. No reports of any diarrhea - Magnesium level was 0.8, potassium 3.1. Calcium 7.8 - Patient was given potassium, IV calcium gluconate 1 g, magnesium 4 g IV 1 - Magnesium 2.2 at the time of discharge, also given prescription for oral magnesium daily - Please check BMET, magnesium level at the time of follow-up visit   Essential hypertension - Continue lisinopril  Dyslipidemia - Continue Zetia, simvastatin  GERD - Continue PPI  Day of Discharge BP 136/75 (BP Location: Left Arm)   Pulse (!) 58   Temp 98 F (36.7 C) (Oral)   Resp 20   Ht 5\' 8"  (1.727 m)   Wt 85.9 kg (189 lb 6.4 oz)   SpO2 100%   BMI 28.80 kg/m   Physical Exam: General: Alert and awake oriented x3 not in any acute distress. HEENT: anicteric sclera, pupils reactive to light and accommodation CVS: S1-S2 clear no murmur rubs or gallops Chest: clear to auscultation bilaterally, no wheezing rales or rhonchi Abdomen: soft nontender, nondistended, normal bowel sounds Extremities: no cyanosis, clubbing or edema noted bilaterally Neuro: Cranial nerves II-XII intact, no focal neurological deficits   The results of significant diagnostics from this hospitalization (including imaging, microbiology, ancillary and laboratory) are listed below for reference.    LAB RESULTS: Basic Metabolic Panel:  Recent Labs Lab 12/25/15 2234  12/26/15 0703  NA 142  --  142  K 3.1*  --  3.5  CL 107  --  108  CO2 25  --  25  GLUCOSE 137*  --  100*  BUN 12  --  11  CREATININE 1.05  --   0.86  CALCIUM 7.8*  --  8.1*  MG  --   < > 2.2  < > = values in this interval not displayed. Liver Function Tests: No results for input(s): AST, ALT, ALKPHOS, BILITOT, PROT, ALBUMIN in the last 168 hours. No results for input(s): LIPASE, AMYLASE in the last 168 hours. No results for input(s): AMMONIA in the last 168 hours. CBC:  Recent Labs Lab 12/25/15 2234  WBC 9.0  NEUTROABS 6.3  HGB 13.1  HCT 37.4*  MCV 83.9  PLT 233   Cardiac Enzymes:  Recent Labs Lab 12/26/15 0436 12/26/15 0703  TROPONINI <0.03 <0.03   BNP: Invalid input(s): POCBNP CBG: No results for input(s): GLUCAP in the last 168 hours.  Significant Diagnostic Studies:  Dg Chest 2 View  Result Date: 12/25/2015 CLINICAL DATA:  Chest tightness and shortness of breath EXAM: CHEST  2 VIEW COMPARISON:  Chest radiograph 07/11/2014 FINDINGS: Cardiomediastinal contours are normal. No pleural effusion or pneumothorax. No focal airspace consolidation  or pulmonary edema. IMPRESSION: No active cardiopulmonary disease. Electronically Signed   By: Ulyses Jarred M.D.   On: 12/25/2015 23:30   Nm Myocar Multi W/spect W/wall Motion / Ef  Result Date: 12/26/2015 CLINICAL DATA:  Chest pain EXAM: MYOCARDIAL IMAGING WITH SPECT (REST AND PHARMACOLOGIC-STRESS) GATED LEFT VENTRICULAR WALL MOTION STUDY LEFT VENTRICULAR EJECTION FRACTION TECHNIQUE: Standard myocardial SPECT imaging was performed after resting intravenous injection of 10 mCi Tc-58m tetrofosmin. Subsequently, intravenous infusion of Lexiscan was performed under the supervision of the Cardiology staff. At peak effect of the drug, 30 mCi Tc-14m tetrofosmin was injected intravenously and standard myocardial SPECT imaging was performed. Quantitative gated imaging was also performed to evaluate left ventricular wall motion, and estimate left ventricular ejection fraction. COMPARISON:  None. FINDINGS: Perfusion: There is a large fixed perfusion defect involving the apex, inferior  wall, inferolateral wall, and inferoseptal region. There is no stress-induced ischemia. Wall Motion: Normal left ventricular wall motion. No left ventricular dilation. Left Ventricular Ejection Fraction: 59 % End diastolic volume 96 ml End systolic volume 39 ml IMPRESSION: 1. Large fixed defect in the inferior wall, apex, and adjacent lateral and septal regions. 2. Normal left ventricular wall motion. 3. Left ventricular ejection fraction 59% 4. Non invasive risk stratification*: Low risk. *2012 Appropriate Use Criteria for Coronary Revascularization Focused Update: J Am Coll Cardiol. [id number]. http://content.airportbarriers.com.aspx?articleid=[email or web address] Electronically Signed   By: Marybelle Killings M.D.   On: 12/26/2015 12:01    2D ECHO:   Disposition and Follow-up: Discharge Instructions    Diet - low sodium heart healthy    Complete by:  As directed    Increase activity slowly    Complete by:  As directed        DISPOSITION: [place], MD. Schedule an appointment as soon as possible for a visit in 2 week(s).   Specialty:  Cardiology Contact information: [place] [address] [phone]        Dwan Bolt, MD. Schedule an appointment as soon as possible for a visit in 2 week(s).   Specialty:  Endocrinology Contact information: [address] [place] [address] [phone]            Time spent on Discharge: 25 minutes  Signed:   Katrianna Friesenhahn M.D. [facility] 12/26/2015, 12:55 PM Pager: [phone]

## 2015-12-26 NOTE — Consult Note (Signed)
CARDIOLOGY CONSULT NOTE     Primary Care Physician: Dwan Bolt, MD Referring Physician:  Previously Drs Mare Ferrari and Gordy Levan Date: 12/25/2015  Reason for consultation:  Chest discomfort  James Olson is a 65 y.o. male with a h/o nonobstructive CAD, HTN, and HL who presents with chest pain.  He reports doing very well recently.  Yesterday, he had more caffeine than usual and consumed a large dinner at the [employer] in [place].  On the way home, he developed acute, severe chest heaviness with tingling in both hands and pain into both sides of his neck.  He pulled over and called 911.  EMS was called and presented to [facility].  En routne, sl NTG was not helpful.  His symptoms gradually resolved after several hours.  He now presents for further evaluation.  He has previously been seen by Dr Mare Ferrari.  He had a myoview 2010 which showed inferior wall ischemia.  He had cath which showed:  Cath 11/26/2008 revealed normal LM, 50-60% narrowing in the septal perforating branch of LAD with 20-30% narrowing of D1.  LCX has irregularities.  RCA had sequential 20-30% irregularities in its proximal portion.  No significant focal disease.   Today, he denies symptoms of palpitations, further chest pain, shortness of breath, orthopnea, PND, lower extremity edema, dizziness, presyncope, syncope, or neurologic sequela. The patient is tolerating medications without difficulties and is otherwise without complaint today.   Past Medical History:  Diagnosis Date  . Abnormal stress test    H/O  . Chest pain, exertional   . Diverticulosis    H/O  . GERD (gastroesophageal reflux disease)   . Hyperlipidemia   . Hypertension   . Obesity   . RBBB    Past Surgical History:  Procedure Laterality Date  . CARDIAC CATHETERIZATION  11/26/08   EF 55%  WITH DR. Doreatha Lew  . COLONOSCOPY  03/01/07   W/POLYPECTOMY AND BX  . PERCUTANEOUS PINNING PHALANX FRACTURE OF HAND      . allopurinol  300  mg Oral Daily  . aspirin EC  81 mg Oral Daily  . colchicine  0.6 mg Oral BID  . dorzolamide  1 drop Right Eye BID  . enoxaparin (LOVENOX) injection  40 mg Subcutaneous Q24H  . ezetimibe  10 mg Oral Daily  . lisinopril  20 mg Oral QPM  . pantoprazole  40 mg Oral Daily  . simvastatin  40 mg Oral QPM     Allergies  Allergen Reactions  . Ace Inhibitors Cough  . Aspirin Other (See Comments)    Can take enteric coated but not the regular. GI issues  . Benicar [Olmesartan Medoxomil] Cough  . Other     Preservative in some eye drops. Causes eyes to blister.     Social History   Social History  . Marital status: Single    Spouse name: N/A  . Number of children: N/A  . Years of education: N/A   Occupational History  . WINDOW CLERK [employer]   Social History Main Topics  . Smoking status: Never Smoker  . Smokeless tobacco: Never Used  . Alcohol use No  . Drug use: No  . Sexual activity: Not on file   Other Topics Concern  . Not on file   Social History Narrative  . No narrative on file    Family History  Problem Relation Age of Onset  . Arrhythmia Mother     PAF AND HAS PACEMAKER  .  Sudden death Father 84    ROS- All systems are reviewed and negative except as per the HPI above  Physical Exam: Telemetry:  Sinus rhythm Vitals:   12/26/15 0145 12/26/15 0200 12/26/15 0354 12/26/15 0827  BP: 131/76  (!) 152/80 (!) 151/88  Pulse: 63  (!) 51 (!) 56  Resp: (!) 8  12 18   Temp:   98.1 F (36.7 C) 97.6 F (36.4 C)  TempSrc:   Oral Oral  SpO2: 96%  98% 100%  Weight:  189 lb 6.4 oz (85.9 kg)    Height:  5\' 8"  (1.727 m)      GEN- The patient is well appearing, alert and oriented x 3 today.   Head- normocephalic, atraumatic Eyes-  Sclera clear, conjunctiva pink Ears- hearing intact Oropharynx- clear Neck- supple, no JVP Lymph- no cervical lymphadenopathy Lungs- Clear to ausculation bilaterally, normal work of breathing Heart- Regular rate and rhythm, no  murmurs, rubs or gallops, PMI not laterally displaced GI- soft, NT, ND, + BS Extremities- no clubbing, cyanosis, or edema MS- no significant deformity or atrophy Skin- no rash or lesion Psych- euthymic mood, full affect Neuro- strength and sensation are intact  EKG:  Sinus with bifascicular block, unchanged from 2010  Labs:   Lab Results  Component Value Date   WBC 9.0 12/25/2015   HGB 13.1 12/25/2015   HCT 37.4 (L) 12/25/2015   MCV 83.9 12/25/2015   PLT 233 12/25/2015    Recent Labs Lab 12/26/15 0703  NA 142  K 3.5  CL 108  CO2 25  BUN 11  CREATININE 0.86  CALCIUM 8.1*  GLUCOSE 100*   Lab Results  Component Value Date   TROPONINI <0.03 12/26/2015    Lab Results  Component Value Date   CHOL 111 12/26/2015   Lab Results  Component Value Date   HDL 39 (L) 12/26/2015   Lab Results  Component Value Date   LDLCALC 54 12/26/2015   Lab Results  Component Value Date   TRIG 91 12/26/2015   Lab Results  Component Value Date   CHOLHDL 2.8 12/26/2015   No results found for: LDLDIRECT     Echo:  pending  ASSESSMENT AND PLAN:   1. Chest pain Both typical and atypical features.  CMs negative and no acute ekg changes.  Will proceed with myoview at this time.  If low risk, would discharge today with outpatient follow-up.  If moderate to high risk, may need additional evaluation. Continue ASA and statin.  Would avoid beta blockers currently given bifascicular block.  2. HTN Elevated Consider addition of norvasc 5mg  daily at discharge  3. HL Stable No change required today   Thompson Grayer, MD 12/26/2015  9:24 AM

## 2015-12-26 NOTE — ED Notes (Signed)
Spoke with admitting MD at bedside. Pt currently c/o 3/10 chest pain. Tele floor does not take pt's with active chest pain. MD stated that he would change it.

## 2015-12-26 NOTE — H&P (Signed)
History and Physical    James Olson [id number] DOB: 11-18-1950 DOA: 12/25/2015  Referring MD/NP/PA: Dr. Oleta Mouse PCP: Dwan Bolt, MD  Patient coming from: From patient's car via EMS  Chief Complaint: Chest pain  HPI: James Olson is a 65 y.o. male with medical history significant of HTN, HLD, CAD, obesity, and gout; presents with complaints of chest pain. Patient states that last night while driving home after eating a heavy meal in [place] he had acute onset of severe chest heaviness. Rates pain as 8 out of 10 pain scale. Associated symptoms included bilateral hand numbness/stiffness, lightheadedness, and found it harder to breathe. Denies having any nausea, vomiting, leg swelling/pain, recent trips/immobilization, diarrhea, fever, or chills. He is able to pull over to the side of the road and call 911. He states that he's had similar like pain in the past approximately 7 years ago for which he was admitted and underwent stress testing and cardiac catheterization. Review of records shows that patient had ejection fraction of 73% with signs of beer wall ischemia. Patient underwent cardiac catheterization which showed mild two-vessel atherosclerosis.   ED Course: Upon admission to the emergency department patient was seen to have normal vitals. Of work revealed potassium 3.1, calcium 7.8, and initial troponin 0.00. EKG showed a bifascicular block but this also appears to have been present back in tracings noted from 2010. Patient noted only mild relief with the nitroglycerin and was still noted a chest pain 3 out of 10 therefore was changed from it telemetry bed to a stepdown bed.  Review of Systems: As per HPI otherwise 10 point review of systems negative.   Past Medical History:  Diagnosis Date  . Abnormal stress test    H/O  . Chest pain, exertional   . Diverticulosis    H/O  . GERD (gastroesophageal reflux disease)   . Hyperlipidemia   . Hypertension   . Obesity    . RBBB     Past Surgical History:  Procedure Laterality Date  . CARDIAC CATHETERIZATION  11/26/08   EF 55%  WITH DR. Doreatha Lew  . COLONOSCOPY  03/01/07   W/POLYPECTOMY AND BX  . PERCUTANEOUS PINNING PHALANX FRACTURE OF HAND       reports that he has never smoked. He has never used smokeless tobacco. He reports that he does not drink alcohol or use drugs.  Allergies  Allergen Reactions  . Ace Inhibitors Cough  . Aspirin Other (See Comments)    Can take enteric coated but not the regular. GI issues  . Benicar [Olmesartan Medoxomil] Cough  . Other     Preservative in some eye drops. Causes eyes to blister.     Family History  Problem Relation Age of Onset  . Arrhythmia Mother     PAF AND HAS PACEMAKER  . Sudden death Father 48    Prior to Admission medications   Medication Sig Start Date End Date Taking? Authorizing Provider  allopurinol (ZYLOPRIM) 300 MG tablet Take 300 mg by mouth daily.   Yes Historical Provider, MD  colchicine 0.6 MG tablet Take 0.6 mg by mouth 2 (two) times daily.   Yes Historical Provider, MD  dorzolamide (TRUSOPT) 2 % ophthalmic solution Place 1 drop into the right eye 2 (two) times daily.   Yes Historical Provider, MD  ezetimibe (ZETIA) 10 MG tablet Take 10 mg by mouth daily.   Yes Historical Provider, MD  fluocinonide cream (LIDEX) [id number] % Apply 1 application topically 2 (two) times daily as  needed (dry skin).    Yes Historical Provider, MD  lisinopril (PRINIVIL,ZESTRIL) 20 MG tablet Take 20 mg by mouth every evening.   Yes Historical Provider, MD  omeprazole (PRILOSEC) 40 MG capsule Take 40 mg by mouth daily.   Yes Historical Provider, MD  OVER THE COUNTER MEDICATION Take 1 tablet by mouth daily. Macular Degeneration Supplement   Yes Historical Provider, MD  simvastatin (ZOCOR) 40 MG tablet Take 40 mg by mouth every evening.   Yes Historical Provider, MD  aspirin EC 81 MG tablet Take 81 mg by mouth daily.    Historical Provider, MD    Physical  Exam: Constitutional: Elderly male who appears to be in some mild distress. Vitals:   12/25/15 2330 12/25/15 2345 12/26/15 0000 12/26/15 0015  BP: 147/84 142/84 130/79 142/82  Pulse: 76 70 70 65  Resp: 14 17 11 12   Temp:      TempSrc:      SpO2: 96% 97% 97% 96%  Weight:      Height:       Eyes: PERRL, lids and conjunctivae normal ENMT: Mucous membranes are moist. Posterior pharynx clear of any exudate or lesions.Normal dentition.  Neck: normal, supple, no masses, no thyromegaly, No JVD Respiratory: clear to auscultation bilaterally, no wheezing, no crackles. Normal respiratory effort. No accessory muscle use.  Cardiovascular: Regular rate and rhythm, no murmurs / rubs / gallops. No extremity edema. 2+ pedal pulses. No carotid bruits.  Abdomen: no tenderness, no masses palpated. No hepatosplenomegaly. Bowel sounds positive.  Musculoskeletal: no clubbing / cyanosis. No joint deformity upper and lower extremities. Good ROM, no contractures. Normal muscle tone.  Skin: no rashes, lesions, ulcers. No induration Neurologic: CN 2-12 grossly intact. Sensation intact, DTR normal. Strength 5/5 in all 4.  Psychiatric: Normal judgment and insight. Alert and oriented x 3. Normal mood.     Labs on Admission: I have personally reviewed following labs and imaging studies  CBC:  Recent Labs Lab 12/25/15 2234  WBC 9.0  NEUTROABS 6.3  HGB 13.1  HCT 37.4*  MCV 83.9  PLT 0000000   Basic Metabolic Panel:  Recent Labs Lab 12/25/15 2234  NA 142  K 3.1*  CL 107  CO2 25  GLUCOSE 137*  BUN 12  CREATININE 1.05  CALCIUM 7.8*   GFR: Estimated Creatinine Clearance: 72.9 mL/min (by C-G formula based on SCr of 1.05 mg/dL). Liver Function Tests: No results for input(s): AST, ALT, ALKPHOS, BILITOT, PROT, ALBUMIN in the last 168 hours. No results for input(s): LIPASE, AMYLASE in the last 168 hours. No results for input(s): AMMONIA in the last 168 hours. Coagulation Profile: No results for  input(s): INR, PROTIME in the last 168 hours. Cardiac Enzymes: No results for input(s): CKTOTAL, CKMB, CKMBINDEX, TROPONINI in the last 168 hours. BNP (last 3 results) No results for input(s): PROBNP in the last 8760 hours. HbA1C: No results for input(s): HGBA1C in the last 72 hours. CBG: No results for input(s): GLUCAP in the last 168 hours. Lipid Profile: No results for input(s): CHOL, HDL, LDLCALC, TRIG, CHOLHDL, LDLDIRECT in the last 72 hours. Thyroid Function Tests: No results for input(s): TSH, T4TOTAL, FREET4, T3FREE, THYROIDAB in the last 72 hours. Anemia Panel: No results for input(s): VITAMINB12, FOLATE, FERRITIN, TIBC, IRON, RETICCTPCT in the last 72 hours. Urine analysis: No results found for: COLORURINE, APPEARANCEUR, LABSPEC, PHURINE, GLUCOSEU, HGBUR, BILIRUBINUR, KETONESUR, PROTEINUR, UROBILINOGEN, NITRITE, LEUKOCYTESUR Sepsis Labs: No results found for this or any previous visit (from the past 240 hour(s)).  Radiological Exams on Admission: Dg Chest 2 View  Result Date: 12/25/2015 CLINICAL DATA:  Chest tightness and shortness of breath EXAM: CHEST  2 VIEW COMPARISON:  Chest radiograph 07/11/2014 FINDINGS: Cardiomediastinal contours are normal. No pleural effusion or pneumothorax. No focal airspace consolidation or pulmonary edema. IMPRESSION: No active cardiopulmonary disease. Electronically Signed   By: Ulyses Jarred M.D.   On: 12/25/2015 23:30    EKG: Independently reviewed. Bifascicular block seen previous EKGs  Assessment/Plan Chest pain: Acute. Heart score =4. Patient has previous history of cardiac cath back in 2010 at which time he was noted to have mild 2 vessel disease and EF of 75%.  - Admit to step down for continued chest pain symptoms - Trend cardiac enzymes and check lipid panel in a.m. - Check echocardiogram in am - Continue aspirin - Will need to consult cardiology in a.m. determine if patient needs formal stress testing  Electrolyte disturbances  (Hypokalemia and Hypocalcemia): As seen on initial lab work. Patient reports some feeling of numbness/stiffness in his hands question if this is Secondary to electrolyte disturbances. - Potassium chloride 40 mEq 1 dose now - Calcium gluconate 1 g IV 1 dose now - Check magnesium and replace if needed - Continue to monitor and replace as needed  Essential hypertension - Continue lisinopril  Dyslipidemia - Continue Zetia, simvastatin  GERD - Pharmacy substitution of Protonix for omeprazole  DVT prophylaxis: lovenox  Code Status: full  Family Communication: No family present at bedside  Disposition Plan: likely discharge home in a.m. if workup negative Consults called:  none Admission status: Observation  Norval Morton MD [facility] Pager [phone]  If 7PM-7AM, please contact night-coverage [email or web address] Password [facility]  12/26/2015, 12:41 AM

## 2015-12-29 ENCOUNTER — Encounter: Payer: Self-pay | Admitting: *Deleted

## 2015-12-30 ENCOUNTER — Ambulatory Visit (INDEPENDENT_AMBULATORY_CARE_PROVIDER_SITE_OTHER): Payer: Medicare Other | Admitting: Internal Medicine

## 2015-12-30 ENCOUNTER — Encounter: Payer: Self-pay | Admitting: Internal Medicine

## 2015-12-30 ENCOUNTER — Encounter (INDEPENDENT_AMBULATORY_CARE_PROVIDER_SITE_OTHER): Payer: Self-pay

## 2015-12-30 VITALS — BP 142/90 | HR 71 | Ht 68.0 in | Wt 191.0 lb

## 2015-12-30 DIAGNOSIS — R0789 Other chest pain: Secondary | ICD-10-CM

## 2015-12-30 DIAGNOSIS — R42 Dizziness and giddiness: Secondary | ICD-10-CM | POA: Diagnosis not present

## 2015-12-30 DIAGNOSIS — E789 Disorder of lipoprotein metabolism, unspecified: Secondary | ICD-10-CM | POA: Diagnosis not present

## 2015-12-30 DIAGNOSIS — R079 Chest pain, unspecified: Secondary | ICD-10-CM

## 2015-12-30 DIAGNOSIS — E790 Hyperuricemia without signs of inflammatory arthritis and tophaceous disease: Secondary | ICD-10-CM | POA: Diagnosis not present

## 2015-12-30 DIAGNOSIS — I10 Essential (primary) hypertension: Secondary | ICD-10-CM | POA: Diagnosis not present

## 2015-12-30 DIAGNOSIS — N390 Urinary tract infection, site not specified: Secondary | ICD-10-CM | POA: Diagnosis not present

## 2015-12-30 NOTE — Progress Notes (Signed)
PCP: Dwan Bolt, MD Primary Cardiologist:  Previously Dr Whitman Hero is a 65 y.o. male who presents today for routine followup after his hospitalization last week for atypical chest pain.  Since last being seen in our clinic, the patient reports doing very well.  No further symptoms.  Today, he denies symptoms of palpitations, chest pain, shortness of breath,  lower extremity edema, dizziness, presyncope, or syncope.  The patient is otherwise without complaint today.   Past Medical History:  Diagnosis Date  . Abnormal stress test    H/O  . Atypical chest pain   . Diverticulosis    H/O  . GERD (gastroesophageal reflux disease)   . Hyperlipidemia   . Hypertension   . Obesity   . RBBB    Past Surgical History:  Procedure Laterality Date  . CARDIAC CATHETERIZATION  11/26/08   EF 55%  WITH DR. Doreatha Lew  . COLONOSCOPY  03/01/07   W/POLYPECTOMY AND BX  . PERCUTANEOUS PINNING PHALANX FRACTURE OF HAND      ROS- all systems are reviewed and negatives except as per HPI above  Current Outpatient Prescriptions  Medication Sig Dispense Refill  . allopurinol (ZYLOPRIM) 300 MG tablet Take 300 mg by mouth daily.    Marland Kitchen aspirin EC 81 MG tablet Take 81 mg by mouth daily.    . colchicine 0.6 MG tablet Take 0.6 mg by mouth 2 (two) times daily.    . dorzolamide (TRUSOPT) 2 % ophthalmic solution Place 1 drop into the right eye 2 (two) times daily.    Marland Kitchen ezetimibe (ZETIA) 10 MG tablet Take 10 mg by mouth daily.    . fluocinonide cream (LIDEX) AB-123456789 % Apply 1 application topically 2 (two) times daily as needed (dry skin).     Marland Kitchen lisinopril (PRINIVIL,ZESTRIL) 20 MG tablet Take 20 mg by mouth every evening.    . Magnesium Oxide 400 (240 Mg) MG TABS Take 1 tablet by mouth daily. 30 tablet 1  . omeprazole (PRILOSEC) 40 MG capsule Take 40 mg by mouth daily.    Marland Kitchen OVER THE COUNTER MEDICATION Take 1 tablet by mouth daily. Macular Degeneration Supplement    . simvastatin (ZOCOR) 40 MG  tablet Take 40 mg by mouth every evening.     No current facility-administered medications for this visit.     Physical Exam: Vitals:   12/30/15 1042  BP: (!) 142/90  Pulse: 71  SpO2: 98%  Weight: 191 lb (86.6 kg)  Height: 5\' 8"  (1.727 m)    GEN- The patient is well appearing, alert and oriented x 3 today.   Head- normocephalic, atraumatic Eyes-  Sclera clear, conjunctiva pink Ears- hearing intact Oropharynx- clear Lungs- Clear to ausculation bilaterally, normal work of breathing Heart- Regular rate and rhythm, no murmurs, rubs or gallops, PMI not laterally displaced GI- soft, NT, ND, + BS Extremities- no clubbing, cyanosis, or edema  ekg today reveals sinus rhythm with bifascicular block  myoview reveals large inferior scar- reviewed with patient  Assessment and Plan:  1. Atypical chest pain Resolved myoview reviewed with patient Given scar on myoview, will order echo.  Could possibly be artifact from diaphragm.  myoview several years ago revealed inferior wall defect at that time.  Cath was done then and revealed no obstructive disease.  If low risk echo, would favor a conservative approach.  2. Hypokalemia/ hypomagnesemia Likely due to PPI Stop PPI if able Follow-up with PCP next week  3. HTN Stable No change required today  Return to see Truitt Merle in 6 weeks I will see when needed going forward  Thompson Grayer MD, [facility] 12/30/2015 2:33 PM

## 2015-12-30 NOTE — Patient Instructions (Addendum)
Medication Instructions:  Your physician recommends that you continue on your current medications as directed. Please refer to the Current Medication list given to you today.   Labwork: None Ordered   Testing/Procedures: Your physician has requested that you have an echocardiogram. Echocardiography is a painless test that uses sound waves to create images of your heart. It provides your doctor with information about the size and shape of your heart and how well your heart's chambers and valves are working. This procedure takes approximately one hour. There are no restrictions for this procedure.   Follow-Up: Your physician recommends that you schedule a follow-up appointment in: 6 weeks with Truitt Merle, NP   If you need a refill on your cardiac medications before your next appointment, please call your pharmacy.   Thank you for choosing [facility]! Christen Bame, RN [phone]

## 2016-01-05 ENCOUNTER — Other Ambulatory Visit: Payer: Self-pay | Admitting: Endocrinology

## 2016-01-05 DIAGNOSIS — R079 Chest pain, unspecified: Secondary | ICD-10-CM | POA: Diagnosis not present

## 2016-01-05 DIAGNOSIS — E876 Hypokalemia: Secondary | ICD-10-CM | POA: Diagnosis not present

## 2016-01-05 DIAGNOSIS — E789 Disorder of lipoprotein metabolism, unspecified: Secondary | ICD-10-CM | POA: Diagnosis not present

## 2016-01-05 DIAGNOSIS — I10 Essential (primary) hypertension: Secondary | ICD-10-CM | POA: Diagnosis not present

## 2016-01-08 ENCOUNTER — Ambulatory Visit
Admission: RE | Admit: 2016-01-08 | Discharge: 2016-01-08 | Disposition: A | Discharge status: Home / Self Care | Payer: Federal, State, Local not specified - PPO | Source: Ambulatory Visit | Attending: Endocrinology | Admitting: Endocrinology

## 2016-01-08 DIAGNOSIS — K8020 Calculus of gallbladder without cholecystitis without obstruction: Secondary | ICD-10-CM | POA: Diagnosis not present

## 2016-01-08 DIAGNOSIS — R079 Chest pain, unspecified: Secondary | ICD-10-CM

## 2016-01-19 ENCOUNTER — Ambulatory Visit (HOSPITAL_COMMUNITY): Payer: Medicare Other | Attending: Cardiology

## 2016-01-19 ENCOUNTER — Other Ambulatory Visit: Payer: Self-pay

## 2016-01-19 DIAGNOSIS — E785 Hyperlipidemia, unspecified: Secondary | ICD-10-CM | POA: Insufficient documentation

## 2016-01-19 DIAGNOSIS — I358 Other nonrheumatic aortic valve disorders: Secondary | ICD-10-CM | POA: Insufficient documentation

## 2016-01-19 DIAGNOSIS — I119 Hypertensive heart disease without heart failure: Secondary | ICD-10-CM | POA: Insufficient documentation

## 2016-01-19 DIAGNOSIS — R42 Dizziness and giddiness: Secondary | ICD-10-CM | POA: Insufficient documentation

## 2016-01-19 DIAGNOSIS — R079 Chest pain, unspecified: Secondary | ICD-10-CM | POA: Diagnosis not present

## 2016-01-19 DIAGNOSIS — I4510 Unspecified right bundle-branch block: Secondary | ICD-10-CM | POA: Diagnosis not present

## 2016-01-19 DIAGNOSIS — I77810 Thoracic aortic ectasia: Secondary | ICD-10-CM | POA: Diagnosis not present

## 2016-01-19 DIAGNOSIS — I340 Nonrheumatic mitral (valve) insufficiency: Secondary | ICD-10-CM | POA: Diagnosis not present

## 2016-01-29 DIAGNOSIS — K8020 Calculus of gallbladder without cholecystitis without obstruction: Secondary | ICD-10-CM | POA: Diagnosis not present

## 2016-02-03 ENCOUNTER — Encounter: Payer: Self-pay | Admitting: *Deleted

## 2016-02-08 ENCOUNTER — Encounter: Payer: Self-pay | Admitting: Nurse Practitioner

## 2016-02-08 ENCOUNTER — Ambulatory Visit (INDEPENDENT_AMBULATORY_CARE_PROVIDER_SITE_OTHER): Payer: Medicare Other | Admitting: Nurse Practitioner

## 2016-02-08 VITALS — BP 150/100 | HR 69 | Ht 68.0 in | Wt 194.8 lb

## 2016-02-08 DIAGNOSIS — I259 Chronic ischemic heart disease, unspecified: Secondary | ICD-10-CM | POA: Diagnosis not present

## 2016-02-08 DIAGNOSIS — I10 Essential (primary) hypertension: Secondary | ICD-10-CM

## 2016-02-08 DIAGNOSIS — E7800 Pure hypercholesterolemia, unspecified: Secondary | ICD-10-CM | POA: Diagnosis not present

## 2016-02-08 DIAGNOSIS — I77810 Thoracic aortic ectasia: Secondary | ICD-10-CM

## 2016-02-08 NOTE — Patient Instructions (Addendum)
We will be checking the following labs today - NONE   Medication Instructions:    Continue with your current medicines.     Testing/Procedures To Be Arranged:  N/A  Follow-Up:   See me in 6 months    Other Special Instructions:   Buy a BP cuff - Omron is the best brand - keep a check on your BP - goal is 130/80 or less  Regular exercise - 45 minutes a day  Would talk to your PCP about seeing a GI doctor regarding stopping Prilosec    If you need a refill on your cardiac medications before your next appointment, please call your pharmacy.   Call the [place] office at [phone] if you have any questions, problems or concerns.

## 2016-02-08 NOTE — Progress Notes (Signed)
CARDIOLOGY OFFICE NOTE  Date:  02/08/2016    James Olson Date of Birth: 1950-12-19 Medical Record [id number]  PCP:  Dwan Bolt, MD  Cardiologist:  [place]  Chief Complaint  Patient presents with  . Chest Pain    Follow up visit after studies complete - seen for Dr. Rayann Heman    History of Present Illness: James Olson is a 66 y.o. male who presents today for a follow up visit. Seen for Dr. Rayann Heman. Former patient of Dr. Susa Simmonds.  He has a history of HTN, HLD, CAD, obesity and gout. Remote cath with 2 vessel CAD. Cath 11/26/2008 revealed normal LM, 50-60% narrowing in the septal perforating branch of LAD with 20-30% narrowing of D1.  LCX has irregularities.  RCA had sequential 20-30% irregularities in its proximal portion.  No significant focal disease.   Recent admission last month for atypical chest pain - negative evaluation. Magnesium level severely low at 0.8 on admission along with his potassium - both were replaced. Echo and Myoview completed. Myoview with large defect - no wall motion abnormality however on echo.   Comes in today. Here alone. Says he is seeing general surgery - Dr. Excell Seltzer - has plans to get his gallbladder out now due to gallstones being noted on ultrasound. Not scheduled yet - he is waiting to hear. No more chest pain. Not short of breath. No regular exercise. Admits he "likes to eat". Does not check his BP at home. He has had labs by his PCP. Still taking his PPI - tried to get off but had such bad indigestion that he had to restart.   Past Medical History:  Diagnosis Date  . Abnormal stress test    H/O  . Atypical chest pain   . Diverticulosis    H/O  . GERD (gastroesophageal reflux disease)   . Hyperlipidemia   . Hypertension   . Obesity   . RBBB     Past Surgical History:  Procedure Laterality Date  . CARDIAC CATHETERIZATION  11/26/08   EF 55%  WITH DR. Doreatha Lew  . COLONOSCOPY  03/01/07   W/POLYPECTOMY AND BX  .  PERCUTANEOUS PINNING PHALANX FRACTURE OF HAND       Medications: Current Outpatient Prescriptions  Medication Sig Dispense Refill  . allopurinol (ZYLOPRIM) 300 MG tablet Take 300 mg by mouth daily.    Marland Kitchen aspirin EC 81 MG tablet Take 81 mg by mouth daily.    . colchicine 0.6 MG tablet Take 0.6 mg by mouth 2 (two) times daily.    . dorzolamide (TRUSOPT) 2 % ophthalmic solution Place 1 drop into the right eye 2 (two) times daily.    Marland Kitchen ezetimibe (ZETIA) 10 MG tablet Take 10 mg by mouth daily.    . fluocinonide cream (LIDEX) AB-123456789 % Apply 1 application topically 2 (two) times daily as needed (dry skin).     Marland Kitchen lisinopril (PRINIVIL,ZESTRIL) 20 MG tablet Take 20 mg by mouth every evening.    . Magnesium Oxide 400 (240 Mg) MG TABS Take 1 tablet by mouth daily. 30 tablet 1  . omeprazole (PRILOSEC) 40 MG capsule Take 40 mg by mouth daily.    Marland Kitchen OVER THE COUNTER MEDICATION Take 1 tablet by mouth daily. Macular Degeneration Supplement    . simvastatin (ZOCOR) 40 MG tablet Take 40 mg by mouth every evening.     No current facility-administered medications for this visit.     Allergies: Allergies  Allergen Reactions  . Ace  Inhibitors Cough  . Aspirin Other (See Comments)    Can take enteric coated but not the regular. GI issues  . Benicar [Olmesartan Medoxomil] Cough  . Other     Preservative in some eye drops. Causes eyes to blister.     Social History: The patient  reports that he has never smoked. He has never used smokeless tobacco. He reports that he does not drink alcohol or use drugs.   Family History: The patient's family history includes Arrhythmia in his mother; Atrial fibrillation in his mother; Sudden death (age of onset: 56) in his father.   Review of Systems: Please see the history of present illness.   Otherwise, the review of systems is positive for none.   All other systems are reviewed and negative.   Physical Exam: VS:  BP (!) 150/100   Pulse 69   Ht 5\' 8"  (1.727 m)    Wt 194 lb 12.8 oz (88.4 kg)   SpO2 96% Comment: at rest  BMI 29.62 kg/m  .  BMI Body mass index is 29.62 kg/m.  Wt Readings from Last 3 Encounters:  02/08/16 194 lb 12.8 oz (88.4 kg)  12/30/15 191 lb (86.6 kg)  12/26/15 189 lb 6.4 oz (85.9 kg)   BP is 140/100 by me  General: Pleasant. Obese male who is alert and in no acute distress.   HEENT: Normal.  Neck: Supple, no JVD, carotid bruits, or masses noted.  Cardiac: Regular rate and rhythm. No murmurs, rubs, or gallops. No edema.  Respiratory:  Lungs are clear to auscultation bilaterally with normal work of breathing.  GI: Soft and nontender.  MS: No deformity or atrophy. Gait and ROM intact.  Skin: Warm and dry. Color is normal.  Neuro:  Strength and sensation are intact and no gross focal deficits noted.  Psych: Alert, appropriate and with normal affect.   LABORATORY DATA:  EKG:  EKG is not ordered today.  Lab Results  Component Value Date   WBC 9.0 12/25/2015   HGB 13.1 12/25/2015   HCT 37.4 (L) 12/25/2015   PLT 233 12/25/2015   GLUCOSE 100 (H) 12/26/2015   CHOL 111 12/26/2015   TRIG 91 12/26/2015   HDL 39 (L) 12/26/2015   LDLCALC 54 12/26/2015   NA 142 12/26/2015   K 3.5 12/26/2015   CL 108 12/26/2015   CREATININE 0.86 12/26/2015   BUN 11 12/26/2015   CO2 25 12/26/2015   INR 0.94 06/13/2009    BNP (last 3 results) No results for input(s): BNP in the last 8760 hours.  ProBNP (last 3 results) No results for input(s): PROBNP in the last 8760 hours.   Other Studies Reviewed Today:  ABDOMINAL US IMPRESSION: Cholelithiasis without evidence acute cholecystitis or biliary dilatation.   Electronically Signed   By: Lavonia Dana M.D.   On: 01/08/2016 08:20   Echo Study Conclusions 01/2016  - Left ventricle: The cavity size was normal. There was mild focal   basal hypertrophy of the septum. Systolic function was normal.   The estimated ejection fraction was in the range of 55% to 60%.   Wall motion was  normal; there were no regional wall motion   abnormalities. Doppler parameters are consistent with abnormal   left ventricular relaxation (grade 1 diastolic dysfunction). - Aortic valve: Trileaflet; mildly thickened, mildly calcified   leaflets. - Aorta: Ascending aortic diameter: 38 mm (S). - Ascending aorta: The ascending aorta was mildly dilated. - Mitral valve: There was mild regurgitation. - Left  atrium: The atrium was mildly dilated.   MYOVIEW IMPRESSION 12/2015: 1. Large fixed defect in the inferior wall, apex, and adjacent lateral and septal regions.  2. Normal left ventricular wall motion.  3. Left ventricular ejection fraction 59%  4. Non invasive risk stratification*: Low risk.  *2012 Appropriate Use Criteria for Coronary Revascularization Focused Update: J Am Coll Cardiol. N6492421. http://content.airportbarriers.com.aspx?articleid=1201161   Electronically Signed   By: Marybelle Killings M.D.   On: 12/26/2015 12:01   Assessment/Plan: 1. Chest pain - resolved. Proceeding with cholecystectomy. He should be an acceptable candidate to proceed.   2. Known CAD - recent low risk Myoview noted - did have large defect but stable echo -  Needs CV risk factor modification. Discussed at length.   3. HTN - encouraged him to get a BP cuff and monitor at home - goal is less than 130/80. I suspect he gets too much salt.   4. HLD - on statin therapy - labs checked by PCP  5. Obesity - discussed today  6. Mildly dilated ascending aorta - would follow - needs good BP control.   7. Hypokalemia/hypomagnesium - probably related to his PPI therapy - would suggest seeing GI - will defer to his PCP. Long term use of PPI not recommended.   Current medicines are reviewed with the patient today.  The patient does not have concerns regarding medicines other than what has been noted above.  The following changes have been made:  See above.  Labs/ tests ordered today  include:   No orders of the defined types were placed in this encounter.    Disposition:   FU with me in 6 months. I will follow him going forward.   Patient is agreeable to this plan and will call if any problems develop in the interim.   SignedTruitt Merle, NP  02/08/2016 8:46 AM  [place] [address] [place], [place]  [address] Phone: [phone] Fax: [phone]

## 2016-02-16 ENCOUNTER — Other Ambulatory Visit: Payer: Self-pay | Admitting: General Surgery

## 2016-02-16 DIAGNOSIS — K8010 Calculus of gallbladder with chronic cholecystitis without obstruction: Secondary | ICD-10-CM | POA: Diagnosis not present

## 2016-02-16 DIAGNOSIS — K8012 Calculus of gallbladder with acute and chronic cholecystitis without obstruction: Secondary | ICD-10-CM | POA: Diagnosis not present

## 2016-02-16 DIAGNOSIS — K8066 Calculus of gallbladder and bile duct with acute and chronic cholecystitis without obstruction: Secondary | ICD-10-CM | POA: Diagnosis not present

## 2016-02-18 DIAGNOSIS — R079 Chest pain, unspecified: Secondary | ICD-10-CM | POA: Diagnosis not present

## 2016-02-18 DIAGNOSIS — M542 Cervicalgia: Secondary | ICD-10-CM | POA: Diagnosis not present

## 2016-02-19 ENCOUNTER — Other Ambulatory Visit: Payer: Self-pay | Admitting: Endocrinology

## 2016-02-19 DIAGNOSIS — M542 Cervicalgia: Secondary | ICD-10-CM

## 2016-02-25 ENCOUNTER — Ambulatory Visit
Admission: RE | Admit: 2016-02-25 | Discharge: 2016-02-25 | Disposition: A | Discharge status: Home / Self Care | Payer: Federal, State, Local not specified - PPO | Source: Ambulatory Visit | Attending: Endocrinology | Admitting: Endocrinology

## 2016-02-25 DIAGNOSIS — M542 Cervicalgia: Secondary | ICD-10-CM

## 2016-02-25 DIAGNOSIS — I6523 Occlusion and stenosis of bilateral carotid arteries: Secondary | ICD-10-CM | POA: Diagnosis not present

## 2016-03-07 ENCOUNTER — Encounter: Payer: Self-pay | Admitting: Nurse Practitioner

## 2016-03-07 ENCOUNTER — Ambulatory Visit (INDEPENDENT_AMBULATORY_CARE_PROVIDER_SITE_OTHER): Payer: Medicare Other | Admitting: Nurse Practitioner

## 2016-03-07 VITALS — BP 150/90 | HR 64 | Ht 68.0 in | Wt 193.6 lb

## 2016-03-07 DIAGNOSIS — I10 Essential (primary) hypertension: Secondary | ICD-10-CM | POA: Diagnosis not present

## 2016-03-07 DIAGNOSIS — I259 Chronic ischemic heart disease, unspecified: Secondary | ICD-10-CM | POA: Diagnosis not present

## 2016-03-07 DIAGNOSIS — H9202 Otalgia, left ear: Secondary | ICD-10-CM

## 2016-03-07 DIAGNOSIS — E7800 Pure hypercholesterolemia, unspecified: Secondary | ICD-10-CM

## 2016-03-07 LAB — BASIC METABOLIC PANEL
BUN/Creatinine Ratio: 13 (ref 10–24)
BUN: 13 mg/dL (ref 8–27)
CO2: 22 mmol/L (ref 18–29)
Calcium: 9.3 mg/dL (ref 8.6–10.2)
Chloride: 102 mmol/L (ref 96–106)
Creatinine, Ser: 1.02 mg/dL (ref 0.76–1.27)
GFR calc Af Amer: 88 mL/min/{1.73_m2} (ref >59)
GFR calc non Af Amer: 76 mL/min/{1.73_m2} (ref >59)
Glucose: 101 mg/dL — ABNORMAL HIGH (ref 65–99)
Potassium: 4.3 mmol/L (ref 3.5–5.2)
Sodium: 140 mmol/L (ref 134–144)

## 2016-03-07 MED ORDER — LISINOPRIL 20 MG PO TABS: 20.0000 mg | ORAL_TABLET | Freq: Two times a day (BID) | ORAL | 6 refills | Status: DC

## 2016-03-07 NOTE — Patient Instructions (Addendum)
We will be checking the following labs today - BMET   Medication Instructions:    Continue with your current medicines. BUT  I am changing the Lisinopril to 20 mg to take twice a day - I have sent this to your drug store.     Testing/Procedures To Be Arranged:  N/A  Follow-Up:   See me in one month  Referral to ENT for chronic left ear pain    Other Special Instructions:   Monitor your BP for me - keep a record and bring to your next visit.     If you need a refill on your cardiac medications before your next appointment, please call your pharmacy.   Call the [place] office at [phone] if you have any questions, problems or concerns.

## 2016-03-07 NOTE — Progress Notes (Signed)
CARDIOLOGY OFFICE NOTE  Date:  03/07/2016    James Olson Date of Birth: 1950/04/06 Medical Record [id number]  PCP:  Dwan Bolt, MD  Cardiologist:  [place]    Chief Complaint  Patient presents with  . Coronary Artery Disease    Work in visit - seen for Dr. Rayann Heman    History of Present Illness: James Olson is a 66 y.o. male who presents today for a work in visit. Seen for Dr. Rayann Heman. Former patient of Dr. Susa Simmonds.  He has a history of HTN, HLD, CAD, obesity and gout. Remote cath with 2 vessel CAD. Cath 11/26/2008 revealed normal LM, 50-60% narrowing in the septal perforating branch of LAD with 20-30% narrowing of D1. LCX has irregularities. RCA had sequential 20-30% irregularities in its proximal portion. No significant focal disease.   Recent admission last month for atypical chest pain - negative evaluation. Magnesium level severely low at 0.8 on admission along with his potassium - both were replaced. Echo and Myoview completed. Myoview with large defect - no wall motion abnormality however on echo. I saw him last month - he was going to proceed on with getting his gallbladder out. Electrolyte abnormality probably related to chronic PPI therapy. He was to come back in 6 months and CV risk factor modification was strongly encouraged.   Comes in today. Here alone. Back now due to his carotid doppler study. Apparently ordered by PCP - was having neck pain and a headache - found to have less than 50% diameter stenosis bilaterally. Not really clear why he is back here with me today. He has had his gallbladder out - done at the surgical center - did well. He tells me that he had an ear ache and headache right after his surgery. Ear ache persists - headache does not. He did get a BP cuff - has not used however. Went to join the [employer] - fell and now not exercising.   Past Medical History:  Diagnosis Date  . Abnormal stress test    H/O  . Atypical chest  pain   . Diverticulosis    H/O  . GERD (gastroesophageal reflux disease)   . Hyperlipidemia   . Hypertension   . Obesity   . RBBB     Past Surgical History:  Procedure Laterality Date  . CARDIAC CATHETERIZATION  11/26/08   EF 55%  WITH DR. Doreatha Lew  . COLONOSCOPY  03/01/07   W/POLYPECTOMY AND BX  . PERCUTANEOUS PINNING PHALANX FRACTURE OF HAND       Medications: Current Outpatient Prescriptions  Medication Sig Dispense Refill  . allopurinol (ZYLOPRIM) 300 MG tablet Take 300 mg by mouth daily.    Marland Kitchen aspirin EC 81 MG tablet Take 81 mg by mouth daily.    . colchicine 0.6 MG tablet Take 0.6 mg by mouth 2 (two) times daily.    . dorzolamide (TRUSOPT) 2 % ophthalmic solution Place 1 drop into the right eye 2 (two) times daily.    Marland Kitchen ezetimibe (ZETIA) 10 MG tablet Take 10 mg by mouth daily.    . fluocinonide cream (LIDEX) [id number] % Apply 1 application topically 2 (two) times daily as needed (dry skin).     Marland Kitchen lisinopril (PRINIVIL,ZESTRIL) 20 MG tablet Take 1 tablet (20 mg total) by mouth 2 (two) times daily. 60 tablet 6  . Magnesium Oxide 400 (240 Mg) MG TABS Take 1 tablet by mouth daily. 30 tablet 1  . omeprazole (PRILOSEC) 40 MG  capsule Take 40 mg by mouth daily.    Marland Kitchen OVER THE COUNTER MEDICATION Take 1 tablet by mouth daily. Macular Degeneration Supplement    . simvastatin (ZOCOR) 40 MG tablet Take 40 mg by mouth every evening.     No current facility-administered medications for this visit.     Allergies: Allergies  Allergen Reactions  . Ace Inhibitors Cough  . Aspirin Other (See Comments)    Can take enteric coated but not the regular. GI issues  . Benicar [Olmesartan Medoxomil] Cough  . Other     Preservative in some eye drops. Causes eyes to blister.     Social History: The patient  reports that he has never smoked. He has never used smokeless tobacco. He reports that he does not drink alcohol or use drugs.   Family History: The patient's family history includes  Arrhythmia in his mother; Atrial fibrillation in his mother; Sudden death (age of onset: 75) in his father.   Review of Systems: Please see the history of present illness.   Otherwise, the review of systems is positive for none.   All other systems are reviewed and negative.   Physical Exam: VS:  BP (!) 150/90   Pulse 64   Ht 5\' 8"  (1.727 m)   Wt 193 lb 9.6 oz (87.8 kg)   BMI 29.44 kg/m  .  BMI Body mass index is 29.44 kg/m.  Wt Readings from Last 3 Encounters:  03/07/16 193 lb 9.6 oz (87.8 kg)  02/08/16 194 lb 12.8 oz (88.4 kg)  12/30/15 191 lb (86.6 kg)   BP is 150/100 by me.   General: Pleasant. He remains obese. He is alert and in no acute distress.   HEENT: Normal.  Neck: Supple, no JVD, carotid bruits, or masses noted.  Cardiac: Regular rate and rhythm. No murmurs, rubs, or gallops. No edema.  Respiratory:  Lungs are clear to auscultation bilaterally with normal work of breathing.  GI: Soft and nontender.  MS: No deformity or atrophy. Gait and ROM intact.  Skin: Warm and dry. Color is normal.  Neuro:  Strength and sensation are intact and no gross focal deficits noted.  Psych: Alert, appropriate and with normal affect.   LABORATORY DATA:  EKG:  EKG is not ordered today.  Lab Results  Component Value Date   WBC 9.0 12/25/2015   HGB 13.1 12/25/2015   HCT 37.4 (L) 12/25/2015   PLT 233 12/25/2015   GLUCOSE 100 (H) 12/26/2015   CHOL 111 12/26/2015   TRIG 91 12/26/2015   HDL 39 (L) 12/26/2015   LDLCALC 54 12/26/2015   NA 142 12/26/2015   K 3.5 12/26/2015   CL 108 12/26/2015   CREATININE 0.86 12/26/2015   BUN 11 12/26/2015   CO2 25 12/26/2015   INR 0.94 06/13/2009    BNP (last 3 results) No results for input(s): BNP in the last 8760 hours.  ProBNP (last 3 results) No results for input(s): PROBNP in the last 8760 hours.   Other Studies Reviewed Today:  CAROTID DOPPLER IMPRESSION 02/2016: 1. Bilateral carotid bifurcation and proximal ICA plaque  resulting in less than 50% diameter stenosis. 2.  Antegrade bilateral vertebral arterial flow.   Electronically Signed   By: Lucrezia Europe M.D.   On: 02/25/2016 15:59  ABDOMINAL US IMPRESSION: Cholelithiasis without evidence acute cholecystitis or biliary dilatation.   Electronically Signed By: Lavonia Dana M.D. On: 01/08/2016 08:20   Echo Study Conclusions 01/2016  - Left ventricle: The cavity size was  normal. There was mild focal basal hypertrophy of the septum. Systolic function was normal. The estimated ejection fraction was in the range of 55% to 60%. Wall motion was normal; there were no regional wall motion abnormalities. Doppler parameters are consistent with abnormal left ventricular relaxation (grade 1 diastolic dysfunction). - Aortic valve: Trileaflet; mildly thickened, mildly calcified leaflets. - Aorta: Ascending aortic diameter: 38 mm (S). - Ascending aorta: The ascending aorta was mildly dilated. - Mitral valve: There was mild regurgitation. - Left atrium: The atrium was mildly dilated.   MYOVIEW IMPRESSION 12/2015: 1. Large fixed defect in the inferior wall, apex, and adjacent lateral and septal regions.   2. Normal left ventricular wall motion.  3. Left ventricular ejection fraction 59%  4. Non invasive risk stratification*: Low risk.  *2012 Appropriate Use Criteria for Coronary Revascularization Focused Update: J Am Coll Cardiol. [id number]. http://content.airportbarriers.com.aspx?articleid=1201161   Electronically Signed By: Marybelle Killings M.D. On: 12/26/2015 12:01    Assessment/Plan:  1. Chest pain - resolved. He's had his cholecystectomy.   2. Known CAD - recent low risk Myoview noted - did have large defect but stable echo -  Needs CV risk factor modification. Discussed at length again today.   3. HTN - BP still elevated - increasing his ACE today - has had prior cough but does not report  this at this time.   4. HLD - on statin therapy - labs checked by PCP  5. Obesity - discussed again today  6. Mildly dilated ascending aorta - would follow - needs good BP control. ACE increased today.   7. Hypokalemia/hypomagnesium - probably related to his PPI therapy - would suggest seeing GI - will defer to his PCP. Long term use of PPI not recommended.   8. Left ear pain - referral to ENT.   Current medicines are reviewed with the patient today.  The patient does not have concerns regarding medicines other than what has been noted above.  The following changes have been made:  See above.  Labs/ tests ordered today include:    Orders Placed This Encounter  Procedures  . Basic metabolic panel  . Ambulatory referral to ENT     Disposition:   FU with me as planned in 6 months.   Patient is agreeable to this plan and will call if any problems develop in the interim.   SignedTruitt Merle, NP  03/07/2016 11:29 AM  [facility] [address] [place], [place]  [address] Phone: [phone] Fax: [phone]

## 2016-03-08 ENCOUNTER — Encounter: Payer: Self-pay | Admitting: Nurse Practitioner

## 2016-03-11 DIAGNOSIS — Z961 Presence of intraocular lens: Secondary | ICD-10-CM | POA: Diagnosis not present

## 2016-03-11 DIAGNOSIS — H40012 Open angle with borderline findings, low risk, left eye: Secondary | ICD-10-CM | POA: Diagnosis not present

## 2016-03-11 DIAGNOSIS — H353112 Nonexudative age-related macular degeneration, right eye, intermediate dry stage: Secondary | ICD-10-CM | POA: Diagnosis not present

## 2016-03-11 DIAGNOSIS — H401112 Primary open-angle glaucoma, right eye, moderate stage: Secondary | ICD-10-CM | POA: Diagnosis not present

## 2016-03-25 ENCOUNTER — Encounter: Payer: Self-pay | Admitting: Nurse Practitioner

## 2016-03-28 DIAGNOSIS — I10 Essential (primary) hypertension: Secondary | ICD-10-CM | POA: Diagnosis not present

## 2016-03-28 DIAGNOSIS — N390 Urinary tract infection, site not specified: Secondary | ICD-10-CM | POA: Diagnosis not present

## 2016-03-28 DIAGNOSIS — E790 Hyperuricemia without signs of inflammatory arthritis and tophaceous disease: Secondary | ICD-10-CM | POA: Diagnosis not present

## 2016-03-28 DIAGNOSIS — E789 Disorder of lipoprotein metabolism, unspecified: Secondary | ICD-10-CM | POA: Diagnosis not present

## 2016-03-29 DIAGNOSIS — R51 Headache: Secondary | ICD-10-CM | POA: Diagnosis not present

## 2016-04-05 ENCOUNTER — Ambulatory Visit: Payer: Medicare Other | Admitting: Nurse Practitioner

## 2016-04-05 DIAGNOSIS — E789 Disorder of lipoprotein metabolism, unspecified: Secondary | ICD-10-CM | POA: Diagnosis not present

## 2016-04-05 DIAGNOSIS — I2510 Atherosclerotic heart disease of native coronary artery without angina pectoris: Secondary | ICD-10-CM | POA: Diagnosis not present

## 2016-04-05 NOTE — Progress Notes (Signed)
CARDIOLOGY OFFICE NOTE  Date:  04/06/2016    [name] Date of Birth: 1950/11/25 Medical Record #[id number]  PCP:  Dwan Bolt, MD  Cardiologist:  [place]    Chief Complaint  Patient presents with  . Hypertension    1 month check- seen for Dr. Rayann Heman    History of Present Illness: James Olson is a 66 y.o. male who presents today for a one month check. Seen for Dr. Rayann Heman. Former patient of Dr. Susa Simmonds.  He has a history of HTN, HLD, CAD, obesity and gout. Remote cath with 2 vessel CAD. Cath 11/26/2008 revealed normal LM, 50-60% narrowing in the septal perforating branch of LAD with 20-30% narrowing of D1. LCX has irregularities. RCA had sequential 20-30% irregularities in its proximal portion. No significant focal disease.  Admitted back in December - had atypical chest pain - negative evaluation. Magnesium level severely low at 0.8 on admission along with his potassium - both were replaced. Echo and Myoview completed. Myoview with large defect - no wall motion abnormality however on echo. I then saw him in follow up - he was going to proceed on with getting his gallbladder out. Electrolyte abnormality probably related to chronic PPI therapy. He was to come back in 6 months and CV risk factor modification was strongly encouraged.   I then saw him last month for a work in visit - he had had a carotid doppler study - has less than 50% bilateral stenosis. He was having neck pain, ear pain and a headache. Noted that his BP was up. I increased his ACE and asked him to monitor his BP at home.   Comes in today. Here alone. Says he is doing ok. Still with an "itch" in his ear. Has seen ENT and told he was "fine". No chest pain. Breathing is ok. Now walking. His blood pressure diary is reviewed - most readings are in the AM. They are predominantly high. He eats out most of his meals. Getting too much salt. Likes Coke as well. He is trying to move more  and is walking almost every day.   Past Medical History:  Diagnosis Date  . Abnormal stress test    H/O  . Atypical chest pain   . Diverticulosis    H/O  . GERD (gastroesophageal reflux disease)   . Hyperlipidemia   . Hypertension   . Obesity   . RBBB     Past Surgical History:  Procedure Laterality Date  . CARDIAC CATHETERIZATION  11/26/08   EF 55%  WITH DR. Doreatha Lew  . COLONOSCOPY  03/01/07   W/POLYPECTOMY AND BX  . PERCUTANEOUS PINNING PHALANX FRACTURE OF HAND       Medications: Current Outpatient Prescriptions  Medication Sig Dispense Refill  . allopurinol (ZYLOPRIM) 300 MG tablet Take 300 mg by mouth daily.    Marland Kitchen aspirin EC 81 MG tablet Take 81 mg by mouth daily.    . colchicine 0.6 MG tablet Take 0.6 mg by mouth 2 (two) times daily.    . dorzolamide (TRUSOPT) 2 % ophthalmic solution Place 1 drop into the right eye 2 (two) times daily.    Marland Kitchen ezetimibe (ZETIA) 10 MG tablet Take 10 mg by mouth daily.    . fluocinonide cream (LIDEX) 1.19 % Apply 1 application topically 2 (two) times daily as needed (dry skin).     Marland Kitchen lisinopril (PRINIVIL,ZESTRIL) 20 MG tablet Take 1 tablet (20 mg total) by mouth 2 (two) times  daily. 60 tablet 6  . Magnesium Oxide 400 (240 Mg) MG TABS Take 1 tablet by mouth daily. 30 tablet 1  . omeprazole (PRILOSEC) 40 MG capsule Take 40 mg by mouth daily.    Marland Kitchen OVER THE COUNTER MEDICATION Take 1 tablet by mouth daily. Macular Degeneration Supplement    . simvastatin (ZOCOR) 40 MG tablet Take 40 mg by mouth every evening.    . hydrochlorothiazide (HYDRODIURIL) 25 MG tablet Take 1 tablet (25 mg total) by mouth daily. 90 tablet 3   No current facility-administered medications for this visit.     Allergies: Allergies  Allergen Reactions  . Ace Inhibitors Cough  . Aspirin Other (See Comments)    Can take enteric coated but not the regular. GI issues  . Benicar [Olmesartan Medoxomil] Cough  . Other     Preservative in some eye drops. Causes eyes to  blister.     Social History: The patient  reports that he has never smoked. He has never used smokeless tobacco. He reports that he does not drink alcohol or use drugs.   Family History: The patient's family history includes Arrhythmia in his mother; Atrial fibrillation in his mother; Sudden death (age of onset: 18) in his father.   Review of Systems: Please see the history of present illness.   Otherwise, the review of systems is positive for none.   All other systems are reviewed and negative.   Physical Exam: VS:  BP (!) 154/100   Pulse 71   Ht 5\' 8"  (1.727 m)   Wt 194 lb 12.8 oz (88.4 kg)   SpO2 95% Comment: at rest  BMI 29.62 kg/m  .  BMI Body mass index is 29.62 kg/m.  Wt Readings from Last 3 Encounters:  04/06/16 194 lb 12.8 oz (88.4 kg)  03/07/16 193 lb 9.6 oz (87.8 kg)  02/08/16 194 lb 12.8 oz (88.4 kg)    General: Pleasant. Obese male who is alert and in no acute distress.   HEENT: Normal.  Neck: Supple, no JVD, carotid bruits, or masses noted.  Cardiac: Regular rate and rhythm. No murmurs, rubs, or gallops. No edema.  Respiratory:  Lungs are clear to auscultation bilaterally with normal work of breathing.  GI: Soft and nontender.  MS: No deformity or atrophy. Gait and ROM intact.  Skin: Warm and dry. Color is normal.  Neuro:  Strength and sensation are intact and no gross focal deficits noted.  Psych: Alert, appropriate and with normal affect.   LABORATORY DATA:  EKG:  EKG is not ordered today.  Lab Results  Component Value Date   WBC 9.0 12/25/2015   HGB 13.1 12/25/2015   HCT 37.4 (L) 12/25/2015   PLT 233 12/25/2015   GLUCOSE 101 (H) 03/07/2016   CHOL 111 12/26/2015   TRIG 91 12/26/2015   HDL 39 (L) 12/26/2015   LDLCALC 54 12/26/2015   NA 140 03/07/2016   K 4.3 03/07/2016   CL 102 03/07/2016   CREATININE 1.02 03/07/2016   BUN 13 03/07/2016   CO2 22 03/07/2016   INR 0.94 06/13/2009    BNP (last 3 results) No results for input(s): BNP in the  last 8760 hours.  ProBNP (last 3 results) No results for input(s): PROBNP in the last 8760 hours.   Other Studies Reviewed Today:  CAROTID DOPPLER IMPRESSION 02/2016: 1. Bilateral carotid bifurcation and proximal ICA plaque resulting in less than 50% diameter stenosis. 2. Antegrade bilateral vertebral arterial flow.   Electronically Signed By: Keturah Barre  Vernard Gambles M.D. On: 02/25/2016 15:59  ABDOMINAL US IMPRESSION: Cholelithiasis without evidence acute cholecystitis or biliary dilatation.   Electronically Signed By: Lavonia Dana M.D. On: 01/08/2016 08:20   Echo Study Conclusions 01/2016  - Left ventricle: The cavity size was normal. There was mild focal basal hypertrophy of the septum. Systolic function was normal. The estimated ejection fraction was in the range of 55% to 60%. Wall motion was normal; there were no regional wall motion abnormalities. Doppler parameters are consistent with abnormal left ventricular relaxation (grade 1 diastolic dysfunction). - Aortic valve: Trileaflet; mildly thickened, mildly calcified leaflets. - Aorta: Ascending aortic diameter: 38 mm (S). - Ascending aorta: The ascending aorta was mildly dilated. - Mitral valve: There was mild regurgitation. - Left atrium: The atrium was mildly dilated.   MYOVIEW IMPRESSION 12/2015: 1. Large fixed defect in the inferior wall, apex, and adjacent lateral and septal regions.   2. Normal left ventricular wall motion.  3. Left ventricular ejection fraction 59%  4. Non invasive risk stratification*: Low risk.  *2012 Appropriate Use Criteria for Coronary Revascularization Focused Update: J Am Coll Cardiol. 7494;49(6):759-163. http://content.airportbarriers.com.aspx?articleid=1201161   Electronically Signed By: Marybelle Killings M.D. On: 12/26/2015 12:01    Assessment/Plan:  1. Chest pain - resolved. He's had his cholecystectomy.   2. Known CAD - recent low  risk Myoview noted - did have large defect but stable echo - Needs CV risk factor modification. Discussed at length again today.   3. HTN - BP still elevated - already increased his ACE. Adding HCTZ today. BMET today and repeat in one week. Getting too much salt. Discussed again CV risk factor modification. He will continue to monitor his BP.  4. HLD - on statin therapy - labs checked by PCP  5. Obesity - discussed again today  6. Mildly dilated ascending aorta - would follow - needs good BP control. ACE increased today.   7. Hypokalemia/hypomagnesium - probably related to his PPI therapy - would suggest seeing GI - will defer to his PCP. Long term use of PPI not recommended. Not discussed today.   8. Left ear pain - negative evaluation apparently.  Current medicines are reviewed with the patient today.  The patient does not have concerns regarding medicines other than what has been noted above.  The following changes have been made:  See above.  Labs/ tests ordered today include:    Orders Placed This Encounter  Procedures  . Basic metabolic panel  . Basic metabolic panel     Disposition:   FU with me in 6 months.   Patient is agreeable to this plan and will call if any problems develop in the interim.   SignedTruitt Merle, NP  04/06/2016 9:29 AM  [facility] [address] [place], [place]  [address] Phone: [phone] Fax: [phone]

## 2016-04-06 ENCOUNTER — Ambulatory Visit (INDEPENDENT_AMBULATORY_CARE_PROVIDER_SITE_OTHER): Payer: Medicare Other | Admitting: Nurse Practitioner

## 2016-04-06 ENCOUNTER — Encounter: Payer: Self-pay | Admitting: Nurse Practitioner

## 2016-04-06 ENCOUNTER — Telehealth: Payer: Self-pay | Admitting: Nurse Practitioner

## 2016-04-06 VITALS — BP 154/100 | HR 71 | Ht 68.0 in | Wt 194.8 lb

## 2016-04-06 DIAGNOSIS — E7800 Pure hypercholesterolemia, unspecified: Secondary | ICD-10-CM | POA: Diagnosis not present

## 2016-04-06 DIAGNOSIS — I10 Essential (primary) hypertension: Secondary | ICD-10-CM | POA: Diagnosis not present

## 2016-04-06 DIAGNOSIS — I259 Chronic ischemic heart disease, unspecified: Secondary | ICD-10-CM | POA: Diagnosis not present

## 2016-04-06 LAB — BASIC METABOLIC PANEL
BUN/Creatinine Ratio: 12 (ref 10–24)
BUN: 13 mg/dL (ref 8–27)
CO2: 21 mmol/L (ref 18–29)
Calcium: 9.4 mg/dL (ref 8.6–10.2)
Chloride: 101 mmol/L (ref 96–106)
Creatinine, Ser: 1.05 mg/dL (ref 0.76–1.27)
GFR calc Af Amer: 84 mL/min/{1.73_m2} (ref >59)
GFR calc non Af Amer: 73 mL/min/{1.73_m2} (ref >59)
Glucose: 105 mg/dL — ABNORMAL HIGH (ref 65–99)
Potassium: 3.9 mmol/L (ref 3.5–5.2)
Sodium: 141 mmol/L (ref 134–144)

## 2016-04-06 MED ORDER — HYDROCHLOROTHIAZIDE 25 MG PO TABS: 25.0000 mg | ORAL_TABLET | Freq: Every day | ORAL | 3 refills | Status: DC

## 2016-04-06 NOTE — Patient Instructions (Addendum)
We will be checking the following labs today - BMET   BMET in one week   Medication Instructions:    Continue with your current medicines. BUT  I am adding HCTZ 25 mg to take one a day - this has been sent to your pharmacy    Testing/Procedures To Be Arranged:  N/A  Follow-Up:   See me in about 4 to 6 weeks    Other Special Instructions:   Keep checking your BP for me.   Think about what we talked about today.     If you need a refill on your cardiac medications before your next appointment, please call your pharmacy.   Call the [place] office at [phone] if you have any questions, problems or concerns.

## 2016-04-06 NOTE — Telephone Encounter (Signed)
Follow Up: ° ° °Returning your call,concerning his lab results. °

## 2016-04-07 NOTE — Telephone Encounter (Signed)
Informed pt of lab results. Pt verbalized understanding. 

## 2016-04-07 NOTE — Telephone Encounter (Signed)
Left message to call back  

## 2016-04-07 NOTE — Telephone Encounter (Signed)
Follow Up: ° ° ° °Returning your call, concerning his results. °

## 2016-04-13 ENCOUNTER — Other Ambulatory Visit: Payer: Medicare Other | Admitting: *Deleted

## 2016-04-13 DIAGNOSIS — I259 Chronic ischemic heart disease, unspecified: Secondary | ICD-10-CM | POA: Diagnosis not present

## 2016-04-13 DIAGNOSIS — E7800 Pure hypercholesterolemia, unspecified: Secondary | ICD-10-CM

## 2016-04-13 DIAGNOSIS — I10 Essential (primary) hypertension: Secondary | ICD-10-CM | POA: Diagnosis not present

## 2016-04-14 ENCOUNTER — Other Ambulatory Visit: Payer: Self-pay | Admitting: *Deleted

## 2016-04-14 LAB — BASIC METABOLIC PANEL
BUN/Creatinine Ratio: 17 (ref 10–24)
BUN: 24 mg/dL (ref 8–27)
CO2: 25 mmol/L (ref 18–29)
Calcium: 9.9 mg/dL (ref 8.6–10.2)
Chloride: 101 mmol/L (ref 96–106)
Creatinine, Ser: 1.45 mg/dL — ABNORMAL HIGH (ref 0.76–1.27)
GFR calc Af Amer: 57 mL/min/{1.73_m2} — ABNORMAL LOW (ref >59)
GFR calc non Af Amer: 49 mL/min/{1.73_m2} — ABNORMAL LOW (ref >59)
Glucose: 105 mg/dL — ABNORMAL HIGH (ref 65–99)
Potassium: 4.3 mmol/L (ref 3.5–5.2)
Sodium: 141 mmol/L (ref 134–144)

## 2016-04-14 MED ORDER — MAGNESIUM OXIDE -MG SUPPLEMENT 400 (240 MG) MG PO TABS: 2.0000 | ORAL_TABLET | Freq: Every day | ORAL | 1 refills | Status: DC

## 2016-05-10 ENCOUNTER — Ambulatory Visit (INDEPENDENT_AMBULATORY_CARE_PROVIDER_SITE_OTHER): Payer: Medicare Other | Admitting: Nurse Practitioner

## 2016-05-10 ENCOUNTER — Encounter: Payer: Self-pay | Admitting: Nurse Practitioner

## 2016-05-10 VITALS — BP 116/72 | HR 87 | Ht 68.0 in | Wt 190.0 lb

## 2016-05-10 DIAGNOSIS — I259 Chronic ischemic heart disease, unspecified: Secondary | ICD-10-CM | POA: Diagnosis not present

## 2016-05-10 DIAGNOSIS — I10 Essential (primary) hypertension: Secondary | ICD-10-CM | POA: Diagnosis not present

## 2016-05-10 DIAGNOSIS — E7800 Pure hypercholesterolemia, unspecified: Secondary | ICD-10-CM | POA: Diagnosis not present

## 2016-05-10 LAB — BASIC METABOLIC PANEL
BUN/Creatinine Ratio: 14 (ref 10–24)
BUN: 16 mg/dL (ref 8–27)
CO2: 22 mmol/L (ref 18–29)
Calcium: 9.6 mg/dL (ref 8.6–10.2)
Chloride: 101 mmol/L (ref 96–106)
Creatinine, Ser: 1.11 mg/dL (ref 0.76–1.27)
GFR calc Af Amer: 79 mL/min/{1.73_m2} (ref >59)
GFR calc non Af Amer: 68 mL/min/{1.73_m2} (ref >59)
Glucose: 107 mg/dL — ABNORMAL HIGH (ref 65–99)
Potassium: 4 mmol/L (ref 3.5–5.2)
Sodium: 140 mmol/L (ref 134–144)

## 2016-05-10 LAB — MAGNESIUM: Magnesium: 1.7 mg/dL (ref 1.6–2.3)

## 2016-05-10 NOTE — Patient Instructions (Addendum)
We will be checking the following labs today - BMET and MG level   Medication Instructions:    Continue with your current medicines. BUT  Ok to hold the HCTZ altogether. Resume 1/2 tablet only if BP 140/90 consistently    Testing/Procedures To Be Arranged:  N/A  Follow-Up:   See me in 4 months - Tuesday 09/13/16 @ 9:30 am    Other Special Instructions:   Keep up the good work!!    If you need a refill on your cardiac medications before your next appointment, please call your pharmacy.   Call the [place] office at [phone] if you have any questions, problems or concerns.

## 2016-05-10 NOTE — Progress Notes (Signed)
CARDIOLOGY OFFICE NOTE  Date:  05/10/2016    James Olson Date of Birth: September 01, 1950 Medical Record #[id number]  PCP:  Anda Kraft, MD  Cardiologist:  [facility]    Chief Complaint  Patient presents with  . Hypertension    Follow up visit - seen for James Olson    History of Present Illness: James Olson is a 66 y.o. male who presents today for a follow up visit. Seen for James Olson. Former patient of Dr. Susa Simmonds.  He has a history of HTN, HLD, CAD, obesity and gout. Remote cath with 2 vessel CAD. Cath 11/26/2008 revealed normal LM, 50-60% narrowing in the septal perforating branch of LAD with 20-30% narrowing of D1. LCX has irregularities. RCA had sequential 20-30% irregularities in its proximal portion. No significant focal disease.  Admitted back in December of 2017 - had atypical chest pain - negative evaluation. Magnesium level severely low at 0.8 on admission along with his potassium - both were replaced - felt to be related to chronic PPI therapy. Echo and Myoview completed. Myoview with large defect - nowall motion abnormality however on echo. I then saw him in follow up- he was going to proceed on with getting his gallbladder out. CV risk factor modification was strongly encouraged.   I then saw him in March for a work in visit - he had had a carotid doppler study - has less than 50% bilateral stenosis. He was having neck pain, ear pain and a headache. Noted that his BP was up. I increased his ACE and asked him to monitor his BP at home. Seen a month ago - BP still up. Lots of salt. Loves Coke. Added more antihypertensives. Did not get the feeling that he has the motivation to follow thru with life style modification.   Comes in today. Here alone. Doing much better. He has made changes. Less salt. Less eating out. Walking more. Has lost 4 pounds. Felt lightheaded with the full dose of HCTZ and so he cut it back and changed to taking at night. No  chest pain. Some fatigue but overall he has made good progress.   Past Medical History:  Diagnosis Date  . Abnormal stress test    H/O  . Atypical chest pain   . Diverticulosis    H/O  . GERD (gastroesophageal reflux disease)   . Hyperlipidemia   . Hypertension   . Obesity   . RBBB     Past Surgical History:  Procedure Laterality Date  . CARDIAC CATHETERIZATION  11/26/08   EF 55%  WITH DR. Doreatha Lew  . COLONOSCOPY  03/01/07   W/POLYPECTOMY AND BX  . PERCUTANEOUS PINNING PHALANX FRACTURE OF HAND       Medications: Current Outpatient Prescriptions  Medication Sig Dispense Refill  . allopurinol (ZYLOPRIM) 300 MG tablet Take 300 mg by mouth daily.    Marland Kitchen aspirin EC 81 MG tablet Take 81 mg by mouth daily.    . colchicine 0.6 MG tablet Take 0.6 mg by mouth daily.     . dorzolamide (TRUSOPT) 2 % ophthalmic solution Place 1 drop into the right eye 2 (two) times daily.    Marland Kitchen ezetimibe (ZETIA) 10 MG tablet Take 10 mg by mouth daily.    . fluocinonide cream (LIDEX) 8.46 % Apply 1 application topically 2 (two) times daily as needed (dry skin).     . hydrochlorothiazide (HYDRODIURIL) 25 MG tablet Take 12.5 mg by mouth daily.    Marland Kitchen  lisinopril (PRINIVIL,ZESTRIL) 20 MG tablet Take 1 tablet (20 mg total) by mouth 2 (two) times daily. 60 tablet 6  . Magnesium Oxide 400 (240 Mg) MG TABS Take 2 tablets (800 mg total) by mouth daily. 30 tablet 1  . omeprazole (PRILOSEC) 40 MG capsule Take 40 mg by mouth daily.    Marland Kitchen OVER THE COUNTER MEDICATION Take 1 tablet by mouth daily. Macular Degeneration Supplement    . simvastatin (ZOCOR) 40 MG tablet Take 40 mg by mouth every evening.     No current facility-administered medications for this visit.     Allergies: Allergies  Allergen Reactions  . Ace Inhibitors Cough  . Aspirin Other (See Comments)    Can take enteric coated but not the regular. GI issues  . Benicar [Olmesartan Medoxomil] Cough  . Other     Preservative in some eye drops. Causes eyes  to blister.     Social History: The patient  reports that he has never smoked. He has never used smokeless tobacco. He reports that he does not drink alcohol or use drugs.   Family History: The patient's family history includes Arrhythmia in his mother; Atrial fibrillation in his mother; Sudden death (age of onset: 11) in his father.   Review of Systems: Please see the history of present illness.   Otherwise, the review of systems is positive for none.   All other systems are reviewed and negative.   Physical Exam: VS:  BP 116/72   Pulse 87   Ht 5\' 8"  (1.727 m)   Wt 190 lb (86.2 kg)   SpO2 98%   BMI 28.89 kg/m  .  BMI Body mass index is 28.89 kg/m.  Wt Readings from Last 3 Encounters:  05/10/16 190 lb (86.2 kg)  04/06/16 194 lb 12.8 oz (88.4 kg)  03/07/16 193 lb 9.6 oz (87.8 kg)    General: Pleasant. Well developed, well nourished and in no acute distress. He is obese. He is down 4 pounds.   HEENT: Normal.  Neck: Supple, no JVD, carotid bruits, or masses noted.  Cardiac: Regular rate and rhythm. No murmurs, rubs, or gallops. No edema.  Respiratory:  Lungs are clear to auscultation bilaterally with normal work of breathing.  GI: Soft and nontender.  MS: No deformity or atrophy. Gait and ROM intact.  Skin: Warm and dry. Color is normal.  Neuro:  Strength and sensation are intact and no gross focal deficits noted.  Psych: Alert, appropriate and with normal affect.   LABORATORY DATA:  EKG:  EKG is not ordered today.  Lab Results  Component Value Date   WBC 9.0 12/25/2015   HGB 13.1 12/25/2015   HCT 37.4 (L) 12/25/2015   PLT 233 12/25/2015   GLUCOSE 105 (H) 04/13/2016   CHOL 111 12/26/2015   TRIG 91 12/26/2015   HDL 39 (L) 12/26/2015   LDLCALC 54 12/26/2015   NA 141 04/13/2016   K 4.3 04/13/2016   CL 101 04/13/2016   CREATININE 1.45 (H) 04/13/2016   BUN 24 04/13/2016   CO2 25 04/13/2016   INR 0.94 06/13/2009    BNP (last 3 results) No results for input(s):  BNP in the last 8760 hours.  ProBNP (last 3 results) No results for input(s): PROBNP in the last 8760 hours.   Other Studies Reviewed Today:  CAROTID DOPPLER IMPRESSION 02/2016: 1. Bilateral carotid bifurcation and proximal ICA plaque resulting in less than 50% diameter stenosis. 2. Antegrade bilateral vertebral arterial flow.   Electronically Signed By:  Lucrezia Europe M.D. On: 02/25/2016 15:59  ABDOMINAL US IMPRESSION: Cholelithiasis without evidence acute cholecystitis or biliary dilatation.   Electronically Signed By: Lavonia Dana M.D. On: 01/08/2016 08:20   Echo Study Conclusions 01/2016  - Left ventricle: The cavity size was normal. There was mild focal basal hypertrophy of the septum. Systolic function was normal. The estimated ejection fraction was in the range of 55% to 60%. Wall motion was normal; there were no regional wall motion abnormalities. Doppler parameters are consistent with abnormal left ventricular relaxation (grade 1 diastolic dysfunction). - Aortic valve: Trileaflet; mildly thickened, mildly calcified leaflets. - Aorta: Ascending aortic diameter: 38 mm (S). - Ascending aorta: The ascending aorta was mildly dilated. - Mitral valve: There was mild regurgitation. - Left atrium: The atrium was mildly dilated.   MYOVIEW IMPRESSION 12/2015: 1. Large fixed defect in the inferior wall, apex, and adjacent lateral and septal regions.   2. Normal left ventricular wall motion.  3. Left ventricular ejection fraction 59%  4. Non invasive risk stratification*: Low risk.  *2012 Appropriate Use Criteria for Coronary Revascularization Focused Update: J Am Coll Cardiol. 0973;53(2):992-426. http://content.airportbarriers.com.aspx?articleid=1201161   Electronically Signed By: Marybelle Killings M.D. On: 12/26/2015 12:01    Assessment/Plan:  1. Known CAD - recent low risk Myoview noted - did have large defect but  stable echo - Needs CV risk factor modification. Discussed at length again today. No active chest pain.   2. HTN - BP has improved greatly - little hypotension with full dose of HCTZ and BP has done well on 1/2 dose but now really watching his salt. Would like to try and hold HCTZ -  He will continue to monitor his BP at home and restart 1/ dose if consistently above 140/90.   3. HLD - on statin therapy - labs checked by PCP  4. Obesity - he has started tracking his steps and is cutting back on calories. He has lost 4 pounds. I have encouraged him to keep up this good work.   5. Mildly dilated ascending aorta noted on echo from 01/2016 - would follow - needs good BP control. ACE increased today. Repeat study next year.  6. Hypokalemia/hypomagnesium - probably related to his PPI therapy - would suggest seeing GI - will defer to his PCP. Long term use of PPI not recommended. Needs labs today  Current medicines are reviewed with the patient today.  The patient does not have concerns regarding medicines other than what has been noted above.  The following changes have been made:  See above.  Labs/ tests ordered today include:    Orders Placed This Encounter  Procedures  . Basic metabolic panel  . Magnesium     Disposition:   FU with me in 4 months.   Patient is agreeable to this plan and will call if any problems develop in the interim.   SignedTruitt Merle, NP  05/10/2016 11:35 AM  [facility] [address] [place], [place]  [address] Phone: [phone] Fax: [phone]

## 2016-05-20 ENCOUNTER — Telehealth: Payer: Self-pay | Admitting: Nurse Practitioner

## 2016-05-20 NOTE — Telephone Encounter (Signed)
New message    Pt is calling to ask if James Olson knew of any PCP's that she could recommend. His is retiring and he isn't thrilled with who was recommended.

## 2016-05-20 NOTE — Telephone Encounter (Signed)
Let's find out where he is close to - unfortunately, it is very hard to find primary care.

## 2016-05-23 NOTE — Telephone Encounter (Signed)
S/w pt was referred to Dr. Morrison Old with [facility] for PCP.

## 2016-05-31 DIAGNOSIS — E789 Disorder of lipoprotein metabolism, unspecified: Secondary | ICD-10-CM | POA: Diagnosis not present

## 2016-05-31 DIAGNOSIS — R05 Cough: Secondary | ICD-10-CM | POA: Diagnosis not present

## 2016-06-06 DIAGNOSIS — H01006 Unspecified blepharitis left eye, unspecified eyelid: Secondary | ICD-10-CM | POA: Diagnosis not present

## 2016-06-14 DIAGNOSIS — R05 Cough: Secondary | ICD-10-CM | POA: Diagnosis not present

## 2016-06-14 DIAGNOSIS — R0989 Other specified symptoms and signs involving the circulatory and respiratory systems: Secondary | ICD-10-CM | POA: Diagnosis not present

## 2016-06-16 DIAGNOSIS — R05 Cough: Secondary | ICD-10-CM | POA: Diagnosis not present

## 2016-06-21 DIAGNOSIS — I10 Essential (primary) hypertension: Secondary | ICD-10-CM | POA: Diagnosis not present

## 2016-06-21 DIAGNOSIS — E789 Disorder of lipoprotein metabolism, unspecified: Secondary | ICD-10-CM | POA: Diagnosis not present

## 2016-06-28 DIAGNOSIS — J189 Pneumonia, unspecified organism: Secondary | ICD-10-CM | POA: Diagnosis not present

## 2016-06-28 DIAGNOSIS — R0602 Shortness of breath: Secondary | ICD-10-CM | POA: Diagnosis not present

## 2016-06-28 DIAGNOSIS — I10 Essential (primary) hypertension: Secondary | ICD-10-CM | POA: Diagnosis not present

## 2016-06-28 DIAGNOSIS — R899 Unspecified abnormal finding in specimens from other organs, systems and tissues: Secondary | ICD-10-CM | POA: Diagnosis not present

## 2016-07-12 DIAGNOSIS — R05 Cough: Secondary | ICD-10-CM | POA: Diagnosis not present

## 2016-07-12 DIAGNOSIS — J189 Pneumonia, unspecified organism: Secondary | ICD-10-CM | POA: Diagnosis not present

## 2016-08-15 ENCOUNTER — Ambulatory Visit: Payer: Medicare Other | Admitting: Nurse Practitioner

## 2016-09-06 DIAGNOSIS — H40012 Open angle with borderline findings, low risk, left eye: Secondary | ICD-10-CM | POA: Diagnosis not present

## 2016-09-06 DIAGNOSIS — H01006 Unspecified blepharitis left eye, unspecified eyelid: Secondary | ICD-10-CM | POA: Diagnosis not present

## 2016-09-06 DIAGNOSIS — H401112 Primary open-angle glaucoma, right eye, moderate stage: Secondary | ICD-10-CM | POA: Diagnosis not present

## 2016-09-13 ENCOUNTER — Encounter: Payer: Self-pay | Admitting: Nurse Practitioner

## 2016-09-13 ENCOUNTER — Ambulatory Visit (INDEPENDENT_AMBULATORY_CARE_PROVIDER_SITE_OTHER): Payer: Medicare Other | Admitting: Nurse Practitioner

## 2016-09-13 VITALS — BP 120/90 | HR 60 | Ht 68.0 in | Wt 164.8 lb

## 2016-09-13 DIAGNOSIS — E7800 Pure hypercholesterolemia, unspecified: Secondary | ICD-10-CM | POA: Diagnosis not present

## 2016-09-13 DIAGNOSIS — I10 Essential (primary) hypertension: Secondary | ICD-10-CM

## 2016-09-13 DIAGNOSIS — I77810 Thoracic aortic ectasia: Secondary | ICD-10-CM | POA: Diagnosis not present

## 2016-09-13 DIAGNOSIS — I259 Chronic ischemic heart disease, unspecified: Secondary | ICD-10-CM | POA: Diagnosis not present

## 2016-09-13 DIAGNOSIS — Z01818 Encounter for other preprocedural examination: Secondary | ICD-10-CM | POA: Diagnosis not present

## 2016-09-13 NOTE — Progress Notes (Signed)
CARDIOLOGY OFFICE NOTE  Date:  09/13/2016    James Olson Date of Birth: 05/23/1950 Medical Record #[id number]  PCP:  Anda Kraft, MD  Cardiologist:  [facility]    Chief Complaint  Patient presents with  . Coronary Artery Disease    Follow up visit - seen for Dr. Rayann Heman    History of Present Illness: James Olson is a 66 y.o. male who presents today for a follow up visit.  Seen for Dr. Rayann Heman. Former patient of Dr. Susa Simmonds.  He has a history of HTN, HLD, CAD, obesity and gout. Does have bifascicular block. Remote cath with 2 vessel CAD. Cath 11/26/2008 revealed normal LM, 50-60% narrowing in the septal perforating branch of LAD with 20-30% narrowing of D1. LCX has irregularities. RCA had sequential 20-30% irregularities in its proximal portion. No significant focal disease.  Admitted back in December of 2017 - hadatypical chest pain - negative evaluation. Magnesium level severely low at 0.8 on admission along with his potassium - both were replaced - felt to be related to chronic PPI therapy. Echo and Myoview completed. Myoview with large defect - nowall motion abnormality however on echo. I then saw him in follow up- he was going to proceed on with getting his gallbladder out. CV risk factor modification was strongly encouraged.   I then saw him in March for a work in visit - he had had a carotid doppler study - has less than 50% bilateral stenosis. He was having neck pain, ear pain and a headache. Noted that his BP was up. I increased his ACE and asked him to monitor his BP at home. Seen in April - BP still up. Lots of salt. Loves Coke. Added more antihypertensives. Did not get the feeling that he has the motivation to follow thru with life style modification. Last seen by me back in May - he was doing better - actually intentionally exercising and losing weight and trying to use less salt. Dose of HCTZ cut back due to lightheadedness.   Comes in  today. Here alone. He is doing great!. No chest pain. Breathing is good. On less BP medicine. Has actively lost weight - down 30 pounds over the past 5 months. No passing out. He is quite happy with how he is doing.   Past Medical History:  Diagnosis Date  . Abnormal stress test    H/O  . Atypical chest pain   . Diverticulosis    H/O  . GERD (gastroesophageal reflux disease)   . Hyperlipidemia   . Hypertension   . Obesity   . RBBB     Past Surgical History:  Procedure Laterality Date  . CARDIAC CATHETERIZATION  11/26/08   EF 55%  WITH DR. Doreatha Lew  . COLONOSCOPY  03/01/07   W/POLYPECTOMY AND BX  . GALLBLADDER SURGERY    . PERCUTANEOUS PINNING PHALANX FRACTURE OF HAND       Medications: Current Meds  Medication Sig  . allopurinol (ZYLOPRIM) 300 MG tablet Take 300 mg by mouth daily.  Marland Kitchen aspirin EC 81 MG tablet Take 81 mg by mouth daily.  . colchicine 0.6 MG tablet Take 0.6 mg by mouth daily.   . dorzolamide (TRUSOPT) 2 % ophthalmic solution Place 1 drop into the right eye 2 (two) times daily.  Marland Kitchen ezetimibe (ZETIA) 10 MG tablet Take 10 mg by mouth daily.  . fluocinonide cream (LIDEX) 4.50 % Apply 1 application topically 2 (two) times daily as needed (dry  skin).   . losartan (COZAAR) 25 MG tablet Take 25 mg by mouth daily.  . Magnesium Oxide 400 (240 Mg) MG TABS Take 2 tablets (800 mg total) by mouth daily.  Marland Kitchen omeprazole (PRILOSEC) 40 MG capsule Take 40 mg by mouth daily.  Marland Kitchen OVER THE COUNTER MEDICATION Take 1 tablet by mouth daily. Macular Degeneration Supplement  . simvastatin (ZOCOR) 40 MG tablet Take 40 mg by mouth every evening.     Allergies: Allergies  Allergen Reactions  . Ace Inhibitors Cough  . Aspirin Other (See Comments)    Can take enteric coated but not the regular. GI issues  . Benicar [Olmesartan Medoxomil] Cough  . Other     Preservative in some eye drops. Causes eyes to blister.     Social History: The patient  reports that he has never smoked. He  has never used smokeless tobacco. He reports that he does not drink alcohol or use drugs.   Family History: The patient's family history includes Arrhythmia in his mother; Atrial fibrillation in his mother; Sudden death (age of onset: 40) in his father.   Review of Systems: Please see the history of present illness.   Otherwise, the review of systems is positive for none.   All other systems are reviewed and negative.   Physical Exam: VS:  BP 120/90   Pulse 60   Ht 5\' 8"  (1.727 m)   Wt 164 lb 12.8 oz (74.8 kg)   BMI 25.06 kg/m  .  BMI Body mass index is 25.06 kg/m.  Wt Readings from Last 3 Encounters:  09/13/16 164 lb 12.8 oz (74.8 kg)  05/10/16 190 lb (86.2 kg)  04/06/16 194 lb 12.8 oz (88.4 kg)    General: Pleasant. Well developed, well nourished and in no acute distress.   HEENT: Normal.  Neck: Supple, no JVD, carotid bruits, or masses noted.  Cardiac: Regular rate and rhythm. No murmurs, rubs, or gallops. No edema.  Respiratory:  Lungs are clear to auscultation bilaterally with normal work of breathing.  GI: Soft and nontender.  MS: No deformity or atrophy. Gait and ROM intact.  Skin: Warm and dry. Color is normal.  Neuro:  Strength and sensation are intact and no gross focal deficits noted.  Psych: Alert, appropriate and with normal affect.   LABORATORY DATA:  EKG:  EKG is ordered today. This demonstrates sinus with bifascicular block.  Lab Results  Component Value Date   WBC 9.0 12/25/2015   HGB 13.1 12/25/2015   HCT 37.4 (L) 12/25/2015   PLT 233 12/25/2015   GLUCOSE 107 (H) 05/10/2016   CHOL 111 12/26/2015   TRIG 91 12/26/2015   HDL 39 (L) 12/26/2015   LDLCALC 54 12/26/2015   NA 140 05/10/2016   K 4.0 05/10/2016   CL 101 05/10/2016   CREATININE 1.11 05/10/2016   BUN 16 05/10/2016   CO2 22 05/10/2016   INR 0.94 06/13/2009       BNP (last 3 results) No results for input(s): BNP in the last 8760 hours.  ProBNP (last 3 results) No results for  input(s): PROBNP in the last 8760 hours.   Other Studies Reviewed Today:  CAROTID DOPPLER IMPRESSION 02/2016: 1. Bilateral carotid bifurcation and proximal ICA plaque resulting in less than 50% diameter stenosis. 2. Antegrade bilateral vertebral arterial flow.   Electronically Signed By: Lucrezia Europe M.D. On: 02/25/2016 15:59  ABDOMINAL US IMPRESSION: Cholelithiasis without evidence acute cholecystitis or biliary dilatation.   Electronically Signed By: Crist Infante.D.  On: 01/08/2016 08:20   Echo Study Conclusions 01/2016  - Left ventricle: The cavity size was normal. There was mild focal basal hypertrophy of the septum. Systolic function was normal. The estimated ejection fraction was in the range of 55% to 60%. Wall motion was normal; there were no regional wall motion abnormalities. Doppler parameters are consistent with abnormal left ventricular relaxation (grade 1 diastolic dysfunction). - Aortic valve: Trileaflet; mildly thickened, mildly calcified leaflets. - Aorta: Ascending aortic diameter: 38 mm (S). - Ascending aorta: The ascending aorta was mildly dilated. - Mitral valve: There was mild regurgitation. - Left atrium: The atrium was mildly dilated.   MYOVIEW IMPRESSION 12/2015: 1. Large fixed defect in the inferior wall, apex, and adjacent lateral and septal regions.   2. Normal left ventricular wall motion.  3. Left ventricular ejection fraction 59%  4. Non invasive risk stratification*: Low risk.  *2012 Appropriate Use Criteria for Coronary Revascularization Focused Update: J Am Coll Cardiol. 1505;69(7):948-016. http://content.airportbarriers.com.aspx?articleid=1201161   Electronically Signed By: Marybelle Killings M.D. On: 12/26/2015 12:01    Assessment/Plan:  1. Known CAD - low risk Myoview from December 2017 noted - did have large defect but stable echo - Needs continued CV risk factor modification.  No active symptoms.   2. HTN - BP has improved greatly - he has really embraced life style changes which was a surprise to me - encouraged him to keep up the good work.   3. HLD - on statin therapy - labs checked by PCP - noted on KPN tool from June.   4. Obesity - actively lost weight - he looks good.   5. Mildly dilated ascending aorta noted on echo from 01/2016 - would follow - needs good BP control. ACE increased today. Repeat study next year - will need 01/2017.  6. Hypokalemia/hypomagnesium - probably related to his PPI therapy - have suggested seeing GI - have deferred this to his PCP. Long term use of PPI not recommended.   7. Bifascicular block - not on any AV nodal agents. No syncope.   Current medicines are reviewed with the patient today.  The patient does not have concerns regarding medicines other than what has been noted above.  The following changes have been made:  See above.  Labs/ tests ordered today include:   No orders of the defined types were placed in this encounter.    Disposition:   FU with me in 6 months.   Patient is agreeable to this plan and will call if any problems develop in the interim.   SignedTruitt Merle, NP  09/13/2016 10:02 AM  [facility] [address] [place], [place]  [address] Phone: [phone] Fax: [phone]

## 2016-09-13 NOTE — Patient Instructions (Addendum)
We will be checking the following labs today - NONE   Medication Instructions:    Continue with your current medicines.     Testing/Procedures To Be Arranged:  N/A  Follow-Up:   See me in 6 months. You have a follow-up appointment scheduled with Truitt Merle 03/13/2017 at 8:30AM.    Other Special Instructions:   N/A    If you need a refill on your cardiac medications before your next appointment, please call your pharmacy.   Call the [place] office at [phone] if you have any questions, problems or concerns.

## 2016-09-14 DIAGNOSIS — Z125 Encounter for screening for malignant neoplasm of prostate: Secondary | ICD-10-CM | POA: Diagnosis not present

## 2016-09-14 DIAGNOSIS — I10 Essential (primary) hypertension: Secondary | ICD-10-CM | POA: Diagnosis not present

## 2016-09-27 DIAGNOSIS — E789 Disorder of lipoprotein metabolism, unspecified: Secondary | ICD-10-CM | POA: Diagnosis not present

## 2016-09-27 DIAGNOSIS — M859 Disorder of bone density and structure, unspecified: Secondary | ICD-10-CM | POA: Diagnosis not present

## 2016-09-27 DIAGNOSIS — Z1159 Encounter for screening for other viral diseases: Secondary | ICD-10-CM | POA: Diagnosis not present

## 2016-09-27 DIAGNOSIS — Z23 Encounter for immunization: Secondary | ICD-10-CM | POA: Diagnosis not present

## 2016-09-27 DIAGNOSIS — M1A9XX0 Chronic gout, unspecified, without tophus (tophi): Secondary | ICD-10-CM | POA: Diagnosis not present

## 2016-09-27 DIAGNOSIS — I10 Essential (primary) hypertension: Secondary | ICD-10-CM | POA: Diagnosis not present

## 2016-09-27 DIAGNOSIS — M8580 Other specified disorders of bone density and structure, unspecified site: Secondary | ICD-10-CM | POA: Diagnosis not present

## 2016-09-27 DIAGNOSIS — M546 Pain in thoracic spine: Secondary | ICD-10-CM | POA: Diagnosis not present

## 2016-10-18 DIAGNOSIS — R1084 Generalized abdominal pain: Secondary | ICD-10-CM | POA: Diagnosis not present

## 2016-10-18 DIAGNOSIS — N200 Calculus of kidney: Secondary | ICD-10-CM | POA: Diagnosis not present

## 2016-10-28 ENCOUNTER — Other Ambulatory Visit (HOSPITAL_COMMUNITY): Payer: Self-pay | Admitting: Nurse Practitioner

## 2016-10-28 DIAGNOSIS — N200 Calculus of kidney: Secondary | ICD-10-CM

## 2016-10-31 ENCOUNTER — Ambulatory Visit (HOSPITAL_COMMUNITY)
Admission: RE | Admit: 2016-10-31 | Discharge: 2016-10-31 | Disposition: A | Discharge status: Home / Self Care | Payer: Medicare Other | Source: Ambulatory Visit | Attending: Nurse Practitioner | Admitting: Nurse Practitioner

## 2016-10-31 DIAGNOSIS — I700 Atherosclerosis of aorta: Secondary | ICD-10-CM | POA: Insufficient documentation

## 2016-10-31 DIAGNOSIS — K5730 Diverticulosis of large intestine without perforation or abscess without bleeding: Secondary | ICD-10-CM | POA: Diagnosis not present

## 2016-10-31 DIAGNOSIS — R1084 Generalized abdominal pain: Secondary | ICD-10-CM | POA: Insufficient documentation

## 2016-10-31 DIAGNOSIS — N200 Calculus of kidney: Secondary | ICD-10-CM | POA: Diagnosis not present

## 2016-10-31 DIAGNOSIS — Z9049 Acquired absence of other specified parts of digestive tract: Secondary | ICD-10-CM | POA: Insufficient documentation

## 2016-11-03 DIAGNOSIS — M810 Age-related osteoporosis without current pathological fracture: Secondary | ICD-10-CM | POA: Diagnosis not present

## 2016-11-03 DIAGNOSIS — Z79899 Other long term (current) drug therapy: Secondary | ICD-10-CM | POA: Diagnosis not present

## 2016-11-03 DIAGNOSIS — M546 Pain in thoracic spine: Secondary | ICD-10-CM | POA: Diagnosis not present

## 2016-11-03 DIAGNOSIS — G8929 Other chronic pain: Secondary | ICD-10-CM | POA: Diagnosis not present

## 2016-11-09 DIAGNOSIS — K219 Gastro-esophageal reflux disease without esophagitis: Secondary | ICD-10-CM | POA: Diagnosis not present

## 2016-11-09 DIAGNOSIS — J4520 Mild intermittent asthma, uncomplicated: Secondary | ICD-10-CM | POA: Diagnosis not present

## 2016-11-09 DIAGNOSIS — M8580 Other specified disorders of bone density and structure, unspecified site: Secondary | ICD-10-CM | POA: Diagnosis not present

## 2016-11-21 DIAGNOSIS — M5134 Other intervertebral disc degeneration, thoracic region: Secondary | ICD-10-CM | POA: Diagnosis not present

## 2016-11-21 DIAGNOSIS — S239XXA Sprain of unspecified parts of thorax, initial encounter: Secondary | ICD-10-CM | POA: Diagnosis not present

## 2016-12-22 DIAGNOSIS — R109 Unspecified abdominal pain: Secondary | ICD-10-CM | POA: Diagnosis not present

## 2017-03-13 ENCOUNTER — Encounter: Payer: Self-pay | Admitting: Nurse Practitioner

## 2017-03-13 ENCOUNTER — Ambulatory Visit (INDEPENDENT_AMBULATORY_CARE_PROVIDER_SITE_OTHER): Payer: Medicare Other | Admitting: Nurse Practitioner

## 2017-03-13 VITALS — BP 150/90 | HR 61 | Ht 68.0 in | Wt 174.8 lb

## 2017-03-13 DIAGNOSIS — I712 Thoracic aortic aneurysm, without rupture, unspecified: Secondary | ICD-10-CM

## 2017-03-13 DIAGNOSIS — I77810 Thoracic aortic ectasia: Secondary | ICD-10-CM

## 2017-03-13 DIAGNOSIS — I259 Chronic ischemic heart disease, unspecified: Secondary | ICD-10-CM | POA: Diagnosis not present

## 2017-03-13 DIAGNOSIS — I10 Essential (primary) hypertension: Secondary | ICD-10-CM

## 2017-03-13 DIAGNOSIS — E7800 Pure hypercholesterolemia, unspecified: Secondary | ICD-10-CM | POA: Diagnosis not present

## 2017-03-13 LAB — CBC
Hematocrit: 42 % (ref 37.5–51.0)
Hemoglobin: 14.1 g/dL (ref 13.0–17.7)
MCH: 29.1 pg (ref 26.6–33.0)
MCHC: 33.6 g/dL (ref 31.5–35.7)
MCV: 87 fL (ref 79–97)
Platelets: 231 10*3/uL (ref 150–379)
RBC: 4.85 x10E6/uL (ref 4.14–5.80)
RDW: 14.5 % (ref 12.3–15.4)
WBC: 6.8 10*3/uL (ref 3.4–10.8)

## 2017-03-13 LAB — HEPATIC FUNCTION PANEL
ALT: 14 IU/L (ref 0–44)
AST: 19 IU/L (ref 0–40)
Albumin: 4.1 g/dL (ref 3.6–4.8)
Alkaline Phosphatase: 79 IU/L (ref 39–117)
Bilirubin Total: 0.3 mg/dL (ref 0.0–1.2)
Bilirubin, Direct: 0.13 mg/dL (ref 0.00–0.40)
Total Protein: 6.1 g/dL (ref 6.0–8.5)

## 2017-03-13 LAB — LIPID PANEL
Chol/HDL Ratio: 2.3 ratio (ref 0.0–5.0)
Cholesterol, Total: 118 mg/dL (ref 100–199)
HDL: 52 mg/dL (ref >39)
LDL Calculated: 57 mg/dL (ref 0–99)
Triglycerides: 43 mg/dL (ref 0–149)
VLDL Cholesterol Cal: 9 mg/dL (ref 5–40)

## 2017-03-13 LAB — BASIC METABOLIC PANEL
BUN/Creatinine Ratio: 10 (ref 10–24)
BUN: 12 mg/dL (ref 8–27)
CO2: 22 mmol/L (ref 20–29)
Calcium: 9.4 mg/dL (ref 8.6–10.2)
Chloride: 107 mmol/L — ABNORMAL HIGH (ref 96–106)
Creatinine, Ser: 1.18 mg/dL (ref 0.76–1.27)
GFR calc Af Amer: 73 mL/min/{1.73_m2} (ref >59)
GFR calc non Af Amer: 63 mL/min/{1.73_m2} (ref >59)
Glucose: 99 mg/dL (ref 65–99)
Potassium: 4.5 mmol/L (ref 3.5–5.2)
Sodium: 143 mmol/L (ref 134–144)

## 2017-03-13 NOTE — Patient Instructions (Addendum)
We will be checking the following labs today - BMET, CBC, HPF and lipids   Medication Instructions:    Continue with your current medicines.     Testing/Procedures To Be Arranged:  Carotid doppler   CTA of the aorta   Follow-Up:   See me in 6 months    Other Special Instructions:   Keep checking your BP for me.     If you need a refill on your cardiac medications before your next appointment, please call your pharmacy.   Call the [place] office at [phone] if you have any questions, problems or concerns.

## 2017-03-13 NOTE — Progress Notes (Signed)
CARDIOLOGY OFFICE NOTE  Date:  03/13/2017    [name] Date of Birth: [birth year] Medical Record #[id number]  PCP:  Deland Pretty, MD  Cardiologist:  [place] [facility]    Chief Complaint  Patient presents with  . Coronary Artery Disease  . Hypertension  . Hyperlipidemia    6 month check - seen for Dr. Rayann Heman    History of Present Illness: James Olson is a 67 y.o. male who presents today for a 6 month check. Seen for Dr. Rayann Heman. Former patient of Dr. Susa Simmonds. Primarily sees me.   He has a history of HTN, HLD, CAD, obesity and gout. Does have bifascicular block. Remote cath with 2 vessel CAD. Cath 11/26/2008 revealed normal LM, 50-60% narrowing in the septal perforating branch of LAD with 20-30% narrowing of D1. LCX has irregularities. RCA had sequential 20-30% irregularities in its proximal portion. No significant focal disease.  Admitted back in December of 2017 - hadatypical chest pain - negative evaluation. Magnesium level severely low at 0.8 on admission along with his potassium - both were replaced - felt to be related to chronic PPI therapy. Echo and Myoview completed. Myoview with large defect - nowall motion abnormality however on echo. I then saw him in follow up- he was going to proceed on with getting his gallbladder out. CV risk factor modification was strongly encouraged.   I then saw him in [place] for a work in visit - he had had a carotid doppler study - has less than 50% bilateral stenosis. He was having neck pain, ear pain and a headache. Noted that his BP was up. I increased his ACE and asked him to monitor his BP at home. Seen in April of 2018 - BP still up. Lots of salt. Loves Coke. Added more antihypertensives. Did not get the feeling that he has the motivation to follow thru with life style modification. Last seen by me back in September and he was doing quite well. Had lost 30 pounds. BP was good.   Comes in today. Here alone.  he has gotten a little off track - weight up 12 pounds. He says his BP is doing well at home. No chest pain. Not short of breath. Not dizzy or lightheaded. No syncope. He notes that his heart rate will be elevated with exercise - he does not actually count to get a pulse rate. He is hoping to get outside and exercise if the weather improves. Tolerating his medicines without issue.   Past Medical History:  Diagnosis Date  . Abnormal stress test    H/O  . Atypical chest pain   . Diverticulosis    H/O  . GERD (gastroesophageal reflux disease)   . Hyperlipidemia   . Hypertension   . Obesity   . RBBB     Past Surgical History:  Procedure Laterality Date  . CARDIAC CATHETERIZATION  11/26/08   EF 55%  WITH DR. Doreatha Lew  . COLONOSCOPY  03/01/07   W/POLYPECTOMY AND BX  . GALLBLADDER SURGERY    . PERCUTANEOUS PINNING PHALANX FRACTURE OF HAND       Medications: Current Meds  Medication Sig  . allopurinol (ZYLOPRIM) 300 MG tablet Take 300 mg by mouth daily.  Marland Kitchen aspirin EC 81 MG tablet Take 81 mg by mouth daily.  . colchicine 0.6 MG tablet Take 0.6 mg by mouth daily. Only take if gout flare up  . dorzolamide (TRUSOPT) 2 % ophthalmic solution Place 1 drop into  the right eye 2 (two) times daily.  Marland Kitchen ezetimibe (ZETIA) 10 MG tablet Take 10 mg by mouth daily.  . fluocinonide cream (LIDEX) 4.68 % Apply 1 application topically 2 (two) times daily as needed (dry skin).   Marland Kitchen losartan (COZAAR) 25 MG tablet Take 25 mg by mouth daily.  . Magnesium Oxide 400 (240 Mg) MG TABS Take 2 tablets (800 mg total) by mouth daily.  Marland Kitchen omeprazole (PRILOSEC) 40 MG capsule Take 40 mg by mouth daily.  Marland Kitchen OVER THE COUNTER MEDICATION Take 1 tablet by mouth daily. Macular Degeneration Supplement  . simvastatin (ZOCOR) 40 MG tablet Take 40 mg by mouth every evening.     Allergies: Allergies  Allergen Reactions  . Ace Inhibitors Cough  . Aspirin Other (See Comments)    Can take enteric coated but not the regular. GI  issues  . Benicar [Olmesartan Medoxomil] Cough  . Other     Preservative in some eye drops. Causes eyes to blister.     Social History: The patient  reports that  has never smoked. he has never used smokeless tobacco. He reports that he does not drink alcohol or use drugs.   Family History: The patient's family history includes Arrhythmia in his mother; Atrial fibrillation in his mother; Sudden death (age of onset: 36) in his father.   Review of Systems: Please see the history of present illness.   Otherwise, the review of systems is positive for none.   All other systems are reviewed and negative.   Physical Exam: VS:  BP (!) 150/90 (BP Location: Left Arm, Patient Position: Sitting, Cuff Size: Normal)   Pulse 61   Ht 5\' 8"  (1.727 m)   Wt 174 lb 12.8 oz (79.3 kg)   SpO2 97% Comment: at rest  BMI 26.58 kg/m  .  BMI Body mass index is 26.58 kg/m.  Wt Readings from Last 3 Encounters:  03/13/17 174 lb 12.8 oz (79.3 kg)  09/13/16 164 lb 12.8 oz (74.8 kg)  05/10/16 190 lb (86.2 kg)    General: Pleasant. Well developed, well nourished and in no acute distress.   HEENT: Normal.  Neck: Supple, no JVD, carotid bruits, or masses noted.  Cardiac: Regular rate and rhythm.Heart tones are distant. No edema.  Respiratory:  Lungs are clear to auscultation bilaterally with normal work of breathing.  GI: Soft and nontender.  MS: No deformity or atrophy. Gait and ROM intact.  Skin: Warm and dry. Color is normal.  Neuro:  Strength and sensation are intact and no gross focal deficits noted.  Psych: Alert, appropriate and with normal affect.   LABORATORY DATA:  EKG:  EKG is not ordered today.   Lab Results  Component Value Date   WBC 9.0 12/25/2015   HGB 13.1 12/25/2015   HCT 37.4 (L) 12/25/2015   PLT 233 12/25/2015   GLUCOSE 107 (H) 05/10/2016   CHOL 111 12/26/2015   TRIG 91 12/26/2015   HDL 39 (L) 12/26/2015   LDLCALC 54 12/26/2015   NA 140 05/10/2016   K 4.0 05/10/2016   CL  101 05/10/2016   CREATININE 1.11 05/10/2016   BUN 16 05/10/2016   CO2 22 05/10/2016   INR 0.94 06/13/2009       BNP (last 3 results) No results for input(s): BNP in the last 8760 hours.  ProBNP (last 3 results) No results for input(s): PROBNP in the last 8760 hours.   Other Studies Reviewed Today:  CAROTID DOPPLER IMPRESSION 02/2016: 1. Bilateral carotid  bifurcation and proximal ICA plaque resulting in less than 50% diameter stenosis. 2. Antegrade bilateral vertebral arterial flow.   Electronically Signed By: Lucrezia Europe M.D. On: 02/25/2016 15:59  ABDOMINAL US IMPRESSION: Cholelithiasis without evidence acute cholecystitis or biliary dilatation.   Electronically Signed By: Lavonia Dana M.D. On: 01/08/2016 08:20   Echo Study Conclusions 01/2016  - Left ventricle: The cavity size was normal. There was mild focal basal hypertrophy of the septum. Systolic function was normal. The estimated ejection fraction was in the range of 55% to 60%. Wall motion was normal; there were no regional wall motion abnormalities. Doppler parameters are consistent with abnormal left ventricular relaxation (grade 1 diastolic dysfunction). - Aortic valve: Trileaflet; mildly thickened, mildly calcified leaflets. - Aorta: Ascending aortic diameter: 38 mm (S). - Ascending aorta: The ascending aorta was mildly dilated. - Mitral valve: There was mild regurgitation. - Left atrium: The atrium was mildly dilated.   MYOVIEW IMPRESSION 12/2015: 1. Large fixed defect in the inferior wall, apex, and adjacent lateral and septal regions.   2. Normal left ventricular wall motion.  3. Left ventricular ejection fraction 59%  4. Non invasive risk stratification*: Low risk.  *2012 Appropriate Use Criteria for Coronary Revascularization Focused Update: J Am Coll Cardiol.  0932;35(5):732-202. http://content.airportbarriers.com.aspx?articleid=1201161   Electronically Signed By: Marybelle Killings M.D. On: 12/26/2015 12:01    Assessment/Plan:  1. Known CAD - low risk Myoview from December 2017 noted - did have large defect but stable echo - he has not active symptoms. He is doing well clinically. Continue with CV risk factor modification.   2. HTN - BP up some here today - but with stable outpatient control. No changes made. I have asked him to continue to monitor.   3. HLD - on statin therapy - labs today  4. Obesity - has gotten off track some - he is hopefully to do better if the weather would improve.  5. Mildly dilated ascending aorta noted on echo from 01/2016 - will arrange CTA of the aorta to measure. Reminded of good BP control.   6. Hypokalemia/hypomagnesium - probably related to his PPI therapy - have suggested seeing GI - have deferred this to his PCP. Long term use of PPI still not recommended.   7. Bifascicular block - not on any AV nodal agents. No syncope reported.   8. Carotid disease - will get his doppler study updated.   Current medicines are reviewed with the patient today.  The patient does not have concerns regarding medicines other than what has been noted above.  The following changes have been made:  See above.  Labs/ tests ordered today include:    Orders Placed This Encounter  Procedures  . CT ANGIO CHEST AORTA W &/OR WO CONTRAST  . Basic metabolic panel  . CBC  . Hepatic function panel  . Lipid panel     Disposition:   FU with me in 6 months.  Patient is agreeable to this plan and will call if any problems develop in the interim.   SignedTruitt Merle, NP  03/13/2017 8:50 AM  [facility] [address] [place], [place]  [address] Phone: [phone] Fax: [phone]

## 2017-03-14 DIAGNOSIS — H26493 Other secondary cataract, bilateral: Secondary | ICD-10-CM | POA: Diagnosis not present

## 2017-03-14 DIAGNOSIS — Z961 Presence of intraocular lens: Secondary | ICD-10-CM | POA: Diagnosis not present

## 2017-03-14 DIAGNOSIS — H401112 Primary open-angle glaucoma, right eye, moderate stage: Secondary | ICD-10-CM | POA: Diagnosis not present

## 2017-03-14 DIAGNOSIS — H40012 Open angle with borderline findings, low risk, left eye: Secondary | ICD-10-CM | POA: Diagnosis not present

## 2017-03-16 ENCOUNTER — Other Ambulatory Visit: Payer: Self-pay | Admitting: Nurse Practitioner

## 2017-03-16 ENCOUNTER — Ambulatory Visit (HOSPITAL_COMMUNITY)
Admission: RE | Admit: 2017-03-16 | Discharge: 2017-03-16 | Disposition: A | Discharge status: Home / Self Care | Payer: Medicare Other | Source: Ambulatory Visit | Attending: Cardiovascular Disease | Admitting: Cardiovascular Disease

## 2017-03-16 DIAGNOSIS — I6523 Occlusion and stenosis of bilateral carotid arteries: Secondary | ICD-10-CM

## 2017-03-21 ENCOUNTER — Ambulatory Visit (INDEPENDENT_AMBULATORY_CARE_PROVIDER_SITE_OTHER)
Admission: RE | Admit: 2017-03-21 | Discharge: 2017-03-21 | Disposition: A | Discharge status: Home / Self Care | Payer: Medicare Other | Source: Ambulatory Visit | Attending: Nurse Practitioner | Admitting: Nurse Practitioner

## 2017-03-21 DIAGNOSIS — I712 Thoracic aortic aneurysm, without rupture, unspecified: Secondary | ICD-10-CM

## 2017-03-21 DIAGNOSIS — I700 Atherosclerosis of aorta: Secondary | ICD-10-CM | POA: Diagnosis not present

## 2017-03-21 MED ORDER — IOPAMIDOL (ISOVUE-370) INJECTION 76%
100.0000 mL | Freq: Once | INTRAVENOUS | Status: AC | PRN
  Administered 2017-03-21: 100 mL via INTRAVENOUS

## 2017-03-24 ENCOUNTER — Telehealth: Payer: Self-pay | Admitting: Nurse Practitioner

## 2017-03-24 NOTE — Telephone Encounter (Signed)
Patient calling back, states that he received call to schedule a test (CT scan) and patient states that it may be redundant. Patient would like to know if he needs this test again.

## 2017-03-24 NOTE — Telephone Encounter (Signed)
Spoke with patient and cleared up CT scan scheduling conflict.Marland Kitchen  He verbalized understanding.Marland Kitchen

## 2017-03-28 DIAGNOSIS — H26492 Other secondary cataract, left eye: Secondary | ICD-10-CM | POA: Diagnosis not present

## 2017-03-30 ENCOUNTER — Telehealth: Payer: Self-pay | Admitting: Nurse Practitioner

## 2017-03-30 DIAGNOSIS — H938X2 Other specified disorders of left ear: Secondary | ICD-10-CM | POA: Diagnosis not present

## 2017-03-30 DIAGNOSIS — M810 Age-related osteoporosis without current pathological fracture: Secondary | ICD-10-CM | POA: Diagnosis not present

## 2017-03-30 NOTE — Telephone Encounter (Signed)
Pt is calling to ask if Truitt Merle NP would consider witting him a short letter of support stating that he is a cardiac pt of hers, and is well maintained through her treating regimen. Pt states that the letter needs to support that he is stable/healthy from a cardiac perspective. Pt states that his PCP wrote a short supporting letter today, stating that the pt has a history of osteoporosis, but is well maintained with taking meds for this condition, and he is a healthy 66 year old pt.  Pt states that the reason he is requesting this is to apply for unlimited long term health care/coverage, through [employer]. Pt states he is currently under this plan but for only 5 years, and now he would like to be on the plan indefinitely.   Pt states this is necessary for him, for he solely lives alone, has no children or other family members, and will need this coverage if he becomes "acutely ill" one day. Pt states that after the letter is written, he needs Lori's CMA to mail this to the address mentioned below:   [address]   [place], [address]  Informed the pt that [facility] are out of the office today, but I will route this request to them for further review, recommendation, and follow-up with the pt thereafter.  Pt verbalized understanding and agrees with this plan.

## 2017-03-30 NOTE — Telephone Encounter (Signed)
New message  Pt verbalized that he is calling for Cecille Rubin or RN  Pt want a letter to [employer],  former [employer] employees due to his insurance denial.  Pt is trying to get approved to have long term care.   The letter needs to be composed on a company's letterhead and sent by mail in a company envelope ( pt instructed that he would like a call in the next 30 min and he will have the mailing address and to further the discussion)  Pt said due to his insurance, he would like to have the letter without having an office appt

## 2017-04-03 ENCOUNTER — Encounter: Payer: Self-pay | Admitting: Nurse Practitioner

## 2017-04-04 NOTE — Telephone Encounter (Signed)
Will send letter today to above mentioned address.

## 2017-04-11 DIAGNOSIS — H26491 Other secondary cataract, right eye: Secondary | ICD-10-CM | POA: Diagnosis not present

## 2017-04-11 DIAGNOSIS — H6690 Otitis media, unspecified, unspecified ear: Secondary | ICD-10-CM | POA: Diagnosis not present

## 2017-04-25 DIAGNOSIS — M26609 Unspecified temporomandibular joint disorder, unspecified side: Secondary | ICD-10-CM | POA: Diagnosis not present

## 2017-04-25 DIAGNOSIS — J302 Other seasonal allergic rhinitis: Secondary | ICD-10-CM | POA: Diagnosis not present

## 2017-04-27 DIAGNOSIS — R6884 Jaw pain: Secondary | ICD-10-CM | POA: Diagnosis not present

## 2017-04-27 DIAGNOSIS — H6993 Unspecified Eustachian tube disorder, bilateral: Secondary | ICD-10-CM | POA: Diagnosis not present

## 2017-04-27 DIAGNOSIS — J300 Vasomotor rhinitis: Secondary | ICD-10-CM | POA: Diagnosis not present

## 2017-08-10 DIAGNOSIS — H40012 Open angle with borderline findings, low risk, left eye: Secondary | ICD-10-CM | POA: Diagnosis not present

## 2017-08-10 DIAGNOSIS — H401112 Primary open-angle glaucoma, right eye, moderate stage: Secondary | ICD-10-CM | POA: Diagnosis not present

## 2017-08-31 DIAGNOSIS — I10 Essential (primary) hypertension: Secondary | ICD-10-CM | POA: Diagnosis not present

## 2017-08-31 DIAGNOSIS — H6982 Other specified disorders of Eustachian tube, left ear: Secondary | ICD-10-CM | POA: Diagnosis not present

## 2017-08-31 DIAGNOSIS — M26609 Unspecified temporomandibular joint disorder, unspecified side: Secondary | ICD-10-CM | POA: Diagnosis not present

## 2017-08-31 DIAGNOSIS — R930 Abnormal findings on diagnostic imaging of skull and head, not elsewhere classified: Secondary | ICD-10-CM | POA: Diagnosis not present

## 2017-09-08 ENCOUNTER — Encounter: Payer: Self-pay | Admitting: Nurse Practitioner

## 2017-09-18 ENCOUNTER — Ambulatory Visit: Payer: Medicare Other | Admitting: Nurse Practitioner

## 2017-09-25 ENCOUNTER — Ambulatory Visit (INDEPENDENT_AMBULATORY_CARE_PROVIDER_SITE_OTHER): Payer: Medicare Other | Admitting: Nurse Practitioner

## 2017-09-25 ENCOUNTER — Encounter: Payer: Self-pay | Admitting: Nurse Practitioner

## 2017-09-25 VITALS — BP 150/100 | HR 60 | Ht 68.0 in | Wt 175.4 lb

## 2017-09-25 DIAGNOSIS — I77810 Thoracic aortic ectasia: Secondary | ICD-10-CM | POA: Diagnosis not present

## 2017-09-25 DIAGNOSIS — I10 Essential (primary) hypertension: Secondary | ICD-10-CM | POA: Diagnosis not present

## 2017-09-25 DIAGNOSIS — E7800 Pure hypercholesterolemia, unspecified: Secondary | ICD-10-CM | POA: Diagnosis not present

## 2017-09-25 DIAGNOSIS — I259 Chronic ischemic heart disease, unspecified: Secondary | ICD-10-CM | POA: Diagnosis not present

## 2017-09-25 NOTE — Patient Instructions (Addendum)
We will be checking the following labs today - NONE   Medication Instructions:    Continue with your current medicines.     Testing/Procedures To Be Arranged:  N/A  Follow-Up:   See me in 6 months    Other Special Instructions:   Monitor your BP - take in both arms - take your readings and your cuff to your visit with Dr. Shelia Media. For now, will not increase your medicine    If you need a refill on your cardiac medications before your next appointment, please call your pharmacy.   Call the [place] office at [phone] if you have any questions, problems or concerns.

## 2017-09-25 NOTE — Progress Notes (Signed)
CARDIOLOGY OFFICE NOTE  Date:  09/25/2017    [name] Date of Birth: [birth year] Medical Record #[id number]  PCP:  Deland Pretty, MD  Cardiologist:  [facility] Chief Complaint  Patient presents with  . Hypertension  . Hyperlipidemia  . Coronary Artery Disease    6 month check. Seen for Dr. Rayann Heman    History of Present Illness: James Olson is a 67 y.o. male who presents today for a follow up visit. Seen for Dr. Rayann Heman. Former patient of Dr. Susa Simmonds. Primarily sees me.   He has a history of HTN, HLD, CAD, obesity, thoracic aneurysm (yet not by CT 03/2017) and gout.Does have bifascicular block.Remote cath with 2 vessel CAD. Cath 11/26/2008 revealed normal LM, 50-60% narrowing in the septal perforating branch of LAD with 20-30% narrowing of D1. LCX has irregularities. RCA had sequential 20-30% irregularities in its proximal portion. No significant focal disease.  Admitted back in December of 2017 - hadatypical chest pain - negative evaluation. Magnesium level severely low at 0.8 on admission along with his potassium - both were replaced - felt to be related to chronic PPI therapy. Echo and Myoview completed. Myoview with large defect - nowall motion abnormality however on echo. I then saw him in follow up- he was going to proceed on with getting his gallbladder out. CV risk factor modification was strongly encouraged.   I then saw him in [place] for a work in visit - he had had a carotid doppler study - has less than 50% bilateral stenosis. He was having neck pain, ear pain and a headache. Noted that his BP was up. I increased his ACE and asked him to monitor his BP at home. Seenin Aprilof 2018 - BP still up. Lots of salt. Loves Coke. Added more antihypertensives. Did not get the feeling that he has the motivation to follow thru with life style modification but when I saw him back in September of 2018 - he was doing quite well. Had lost 30 pounds.  BP was good. Last seen in March - weight going back up. Cardiac status ok.   Comes in today. Here alone.He feels like he is doing ok. No chest pain. Breathing is good. No syncope. Having his physical and labs with Dr. Shelia Media coming up. BP tending to stay around 145/90 at home - checks in the left arm. He has not had his medicines yet today. He does eat out considerably. 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 He feels like he is doing well.   Past Medical History:  Diagnosis Date  . Abnormal stress test    H/O  . Atypical chest pain   . Diverticulosis    H/O  . GERD (gastroesophageal reflux disease)   . Hyperlipidemia   . Hypertension   . Obesity   . RBBB     Past Surgical History:  Procedure Laterality Date  . CARDIAC CATHETERIZATION  11/26/08   EF 55%  WITH DR. Doreatha Lew  . COLONOSCOPY  03/01/07   W/POLYPECTOMY AND BX  . GALLBLADDER SURGERY    . PERCUTANEOUS PINNING PHALANX FRACTURE OF HAND       Medications: Current Meds  Medication Sig  . allopurinol (ZYLOPRIM) 300 MG tablet Take 300 mg by mouth daily.  Marland Kitchen aspirin EC 81 MG tablet Take 81 mg by mouth daily.  . colchicine 0.6 MG tablet Take 0.6 mg by mouth daily. Only take  if gout flare up  . dorzolamide (TRUSOPT) 2 % ophthalmic solution Place 1 drop into the right eye 2 (two) times daily.  Marland Kitchen ezetimibe (ZETIA) 10 MG tablet Take 10 mg by mouth daily.  . fluocinonide cream (LIDEX) 0.93 % Apply 1 application topically 2 (two) times daily as needed (dry skin).   . Magnesium Oxide 400 (240 Mg) MG TABS Take 2 tablets (800 mg total) by mouth daily.  Marland Kitchen omeprazole (PRILOSEC) 40 MG capsule Take 40 mg by mouth daily.  Marland Kitchen OVER THE COUNTER MEDICATION Take 1 tablet by mouth daily. Macular Degeneration Supplement  . simvastatin (ZOCOR) 40 MG tablet Take 40 mg by mouth every evening.  Marland Kitchen telmisartan (MICARDIS) 80 MG tablet Take 80 mg by mouth daily.     Allergies: Allergies    Allergen Reactions  . Ace Inhibitors Cough  . Aspirin Other (See Comments)    Can take enteric coated but not the regular. GI issues  . Benicar [Olmesartan Medoxomil] Cough  . Other     Preservative in some eye drops. Causes eyes to blister.     Social History: The patient  reports that he has never smoked. He has never used smokeless tobacco. He reports that he does not drink alcohol or use drugs.   Family History: The patient's family history includes Arrhythmia in his mother; Atrial fibrillation in his mother; Sudden death (age of onset: 25) in his father.   Review of Systems: Please see the history of present illness.   Otherwise, the review of systems is positive for none.   All other systems are reviewed and negative.   Physical Exam: VS:  BP (!) 150/100 (BP Location: Left Arm, Patient Position: Sitting, Cuff Size: Normal)   Pulse 60   Ht 5\' 8"  (1.727 m)   Wt 175 lb 6.4 oz (79.6 kg)   BMI 26.67 kg/m  .  BMI Body mass index is 26.67 kg/m.  Wt Readings from Last 3 Encounters:  09/25/17 175 lb 6.4 oz (79.6 kg)  03/13/17 174 lb 12.8 oz (79.3 kg)  09/13/16 164 lb 12.8 oz (74.8 kg)    BP by me in the right arm is 120/80 - BP is 140/90 in the left arm.   General: Pleasant. Well developed, well nourished and in no acute distress.   HEENT: Normal.  Neck: Supple, no JVD, carotid bruits, or masses noted.  Cardiac: Regular rate and rhythm. No murmurs, rubs, or gallops. No edema.  Respiratory:  Lungs are clear to auscultation bilaterally with normal work of breathing.  GI: Soft and nontender.  MS: No deformity or atrophy. Gait and ROM intact.  Skin: Warm and dry. Color is normal.  Neuro:  Strength and sensation are intact and no gross focal deficits noted.  Psych: Alert, appropriate and with normal affect.   LABORATORY DATA:  EKG:  EKG is ordered today. This demonstrates NSR with bifascicular block - unchanged.  Lab Results  Component Value Date   WBC 6.8 03/13/2017    HGB 14.1 03/13/2017   HCT 42.0 03/13/2017   PLT 231 03/13/2017   GLUCOSE 99 03/13/2017   CHOL 118 03/13/2017   TRIG 43 03/13/2017   HDL 52 03/13/2017   LDLCALC 57 03/13/2017   ALT 14 03/13/2017   AST 19 03/13/2017   NA 143 03/13/2017   K 4.5 03/13/2017   CL 107 (H) 03/13/2017   CREATININE 1.18 03/13/2017   BUN 12 03/13/2017   CO2 22 03/13/2017   INR 0.94 06/13/2009  BNP (last 3 results) No results for input(s): BNP in the last 8760 hours.  ProBNP (last 3 results) No results for input(s): PROBNP in the last 8760 hours.   Other Studies Reviewed Today:  CTA CHEST 03/2017 IMPRESSION: 1. Nonaneurysmal ascending thoracic aorta, measuring up to 3.6 cm in maximal diameter. 2.  Aortic atherosclerosis (ICD10-I70.0).   Electronically Signed   By: Titus Dubin M.D.   On: 03/21/2017 10:20    CAROTID DOPPLER IMPRESSION 02/2016: 1. Bilateral carotid bifurcation and proximal ICA plaque resulting in less than 50% diameter stenosis. 2. Antegrade bilateral vertebral arterial flow.   Electronically Signed By: Lucrezia Europe M.D. On: 02/25/2016 15:59  ABDOMINAL US IMPRESSION: Cholelithiasis without evidence acute cholecystitis or biliary dilatation.   Electronically Signed By: Lavonia Dana M.D. On: 01/08/2016 08:20   Echo Study Conclusions 01/2016  - Left ventricle: The cavity size was normal. There was mild focal basal hypertrophy of the septum. Systolic function was normal. The estimated ejection fraction was in the range of 55% to 60%. Wall motion was normal; there were no regional wall motion abnormalities. Doppler parameters are consistent with abnormal left ventricular relaxation (grade 1 diastolic dysfunction). - Aortic valve: Trileaflet; mildly thickened, mildly calcified leaflets. - Aorta: Ascending aortic diameter: 38 mm (S). - Ascending aorta: The ascending aorta was mildly dilated. - Mitral valve: There was mild  regurgitation. - Left atrium: The atrium was mildly dilated.   MYOVIEW IMPRESSION 12/2015: 1. Large fixed defect in the inferior wall, apex, and adjacent lateral and septal regions.   2. Normal left ventricular wall motion.  3. Left ventricular ejection fraction 59%  4. Non invasive risk stratification*: Low risk.  *2012 Appropriate Use Criteria for Coronary Revascularization Focused Update: J Am Coll Cardiol. 6433;29(5):188-416. http://content.airportbarriers.com.aspx?articleid=1201161   Electronically Signed By: Marybelle Killings M.D. On: 12/26/2015 12:01    Assessment/Plan:  1. Known CAD - low risk Myoviewfrom December 2017noted - did have large defect but stable echo - he has been managed medically. He has no active symptoms. Needs to work on CV risk factor modification.    2. HTN - BP up some here initially - little discrepancy between his arms - I have asked him to check in both arms - he has not had any medicines today. His cuff needs to be checked for correlation as well. He will discuss further with Dr. Shelia Media - if there is true discrepancy - we could get upper extremity doppler study.   3. HLD - on statin therapy - his labs will be checked by PCP here in the next few weeks with his upcoming physical.   4. Obesity -he is trying to get back on track.   5. Mildly dilated ascending aorta noted on echo from 01/2016 - this was not demonstrated on CT from 03/2017  6. Hypokalemia/hypomagnesium - probably related to his PPI therapy -havesuggestedseeing GI - have deferred thisto his PCP. Long term use of PPI still not recommended.This was not discussed today.   7. Bifascicular block - not on any AV nodal agents. No syncope reported.Will follow.   8. Carotid disease - study stable from 03/2017 - has stable 1 to 39% bilateral disease - would favor updating in 2021.   Current medicines are reviewed with the patient today.  The patient does not have  concerns regarding medicines other than what has been noted above.  The following changes have been made:  See above.  Labs/ tests ordered today include:    Orders Placed This Encounter  Procedures  .  EKG 12-Lead     Disposition:   FU with me in 6 months.   Patient is agreeable to this plan and will call if any problems develop in the interim.   SignedTruitt Merle, NP  09/25/2017 8:25 AM  [facility] [address] [place], [place]  [address] Phone: [phone] Fax: [phone]

## 2017-10-02 DIAGNOSIS — Z125 Encounter for screening for malignant neoplasm of prostate: Secondary | ICD-10-CM | POA: Diagnosis not present

## 2017-10-02 DIAGNOSIS — I10 Essential (primary) hypertension: Secondary | ICD-10-CM | POA: Diagnosis not present

## 2017-10-05 DIAGNOSIS — K219 Gastro-esophageal reflux disease without esophagitis: Secondary | ICD-10-CM | POA: Diagnosis not present

## 2017-10-05 DIAGNOSIS — I452 Bifascicular block: Secondary | ICD-10-CM | POA: Diagnosis not present

## 2017-10-05 DIAGNOSIS — I10 Essential (primary) hypertension: Secondary | ICD-10-CM | POA: Diagnosis not present

## 2017-10-05 DIAGNOSIS — Z6828 Body mass index (BMI) 28.0-28.9, adult: Secondary | ICD-10-CM | POA: Diagnosis not present

## 2017-10-05 DIAGNOSIS — I2510 Atherosclerotic heart disease of native coronary artery without angina pectoris: Secondary | ICD-10-CM | POA: Diagnosis not present

## 2017-10-05 DIAGNOSIS — E789 Disorder of lipoprotein metabolism, unspecified: Secondary | ICD-10-CM | POA: Diagnosis not present

## 2017-10-05 DIAGNOSIS — Z1212 Encounter for screening for malignant neoplasm of rectum: Secondary | ICD-10-CM | POA: Diagnosis not present

## 2017-10-05 DIAGNOSIS — Z23 Encounter for immunization: Secondary | ICD-10-CM | POA: Diagnosis not present

## 2017-10-05 DIAGNOSIS — I6523 Occlusion and stenosis of bilateral carotid arteries: Secondary | ICD-10-CM | POA: Diagnosis not present

## 2017-10-05 DIAGNOSIS — M1A9XX0 Chronic gout, unspecified, without tophus (tophi): Secondary | ICD-10-CM | POA: Diagnosis not present

## 2017-10-05 DIAGNOSIS — N200 Calculus of kidney: Secondary | ICD-10-CM | POA: Diagnosis not present

## 2017-10-05 DIAGNOSIS — Z789 Other specified health status: Secondary | ICD-10-CM | POA: Diagnosis not present

## 2017-10-05 DIAGNOSIS — Z0000 Encounter for general adult medical examination without abnormal findings: Secondary | ICD-10-CM | POA: Diagnosis not present

## 2017-11-24 DIAGNOSIS — I77810 Thoracic aortic ectasia: Secondary | ICD-10-CM | POA: Diagnosis not present

## 2017-11-24 DIAGNOSIS — I10 Essential (primary) hypertension: Secondary | ICD-10-CM | POA: Diagnosis not present

## 2017-11-24 DIAGNOSIS — H6982 Other specified disorders of Eustachian tube, left ear: Secondary | ICD-10-CM | POA: Diagnosis not present

## 2018-02-15 DIAGNOSIS — H353133 Nonexudative age-related macular degeneration, bilateral, advanced atrophic without subfoveal involvement: Secondary | ICD-10-CM | POA: Diagnosis not present

## 2018-02-15 DIAGNOSIS — H40012 Open angle with borderline findings, low risk, left eye: Secondary | ICD-10-CM | POA: Diagnosis not present

## 2018-02-15 DIAGNOSIS — H401112 Primary open-angle glaucoma, right eye, moderate stage: Secondary | ICD-10-CM | POA: Diagnosis not present

## 2018-02-15 DIAGNOSIS — H01006 Unspecified blepharitis left eye, unspecified eyelid: Secondary | ICD-10-CM | POA: Diagnosis not present

## 2018-03-26 ENCOUNTER — Ambulatory Visit: Payer: Medicare Other | Admitting: Nurse Practitioner

## 2018-05-01 ENCOUNTER — Telehealth: Payer: Self-pay | Admitting: *Deleted

## 2018-05-01 NOTE — Telephone Encounter (Signed)
lvm with instructions for VT visit on Friday, May 1 if pt cannot keep appt is advised to call office to R/S.  Consent will have to be given day of appt.

## 2018-05-02 NOTE — Telephone Encounter (Signed)
Virtual Visit Pre-Appointment Phone Call  "(Name), I am calling you today to discuss your upcoming appointment. We are currently trying to limit exposure to the virus that causes COVID-19 by seeing patients at home rather than in the office."  1. "What is the BEST phone number to call the day of the visit?" - include this in appointment notes  2. "Do you have or have access to (through a family member/friend) a smartphone with video capability that we can use for your visit?" a. If yes - list this number in appt notes as "cell" (if different from BEST phone #) and list the appointment type as a VIDEO visit in appointment notes b. If no - list the appointment type as a PHONE visit in appointment notes  3. Confirm consent - "In the setting of the current Covid19 crisis, you are scheduled for a (phone or video) visit with your provider on (Friday, May 1) at (8:00 am).  Just as we do with many in-office visits, in order for you to participate in this visit, we must obtain consent.  If you'd like, I can send this to your mychart (if signed up) or email for you to review.  Otherwise, I can obtain your verbal consent now.  All virtual visits are billed to your insurance company just like a normal visit would be.  By agreeing to a virtual visit, we'd like you to understand that the technology does not allow for your provider to perform an examination, and thus may limit your provider's ability to fully assess your condition. If your provider identifies any concerns that need to be evaluated in person, we will make arrangements to do so.  Finally, though the technology is pretty good, we cannot assure that it will always work on either your or our end, and in the setting of a video visit, we may have to convert it to a phone-only visit.  In either situation, we cannot ensure that we have a secure connection.  Are you willing to proceed?" STAFF: Did the patient verbally acknowledge consent to telehealth visit?  Document YES/NO here: YES  4. Advise patient to be prepared - "Two hours prior to your appointment, go ahead and check your blood pressure, pulse, oxygen saturation, and your weight (if you have the equipment to check those) and write them all down. When your visit starts, your provider will ask you for this information. If you have an Apple Watch or Kardia device, please plan to have heart rate information ready on the day of your appointment. Please have a pen and paper handy nearby the day of the visit as well."  5. Give patient instructions for MyChart download to smartphone OR Doximity/Doxy.me as below if video visit (depending on what platform provider is using)  6. Inform patient they will receive a phone call 15 minutes prior to their appointment time (may be from unknown caller ID) so they should be prepared to answer    TELEPHONE CALL NOTE  James Olson has been deemed a candidate for a follow-up tele-health visit to limit community exposure during the Covid-19 pandemic. I spoke with the patient via phone to ensure availability of phone/video source, confirm preferred email & phone number, and discuss instructions and expectations.  I reminded James Olson to be prepared with any vital sign and/or heart rhythm information that could potentially be obtained via home monitoring, at the time of his visit. I reminded James Olson to expect a phone  call prior to his visit.  Kiearra Oyervides Avanell Shackleton 05/02/2018 10:31 AM   INSTRUCTIONS FOR DOWNLOADING THE MYCHART APP TO SMARTPHONE  - The patient must first make sure to have activated MyChart and know their login information - If Apple, go to [employer] and type in MyChart in the search bar and download the app. If Android, ask patient to go to [employer] and type in [place] in the search bar and download the app. The app is free but as with any other app downloads, their phone may require them to verify saved payment  information or Apple/Android password.  - The patient will need to then log into the app with their MyChart username and password, and select [facility] as their healthcare provider to link the account. When it is time for your visit, go to the MyChart app, find appointments, and click Begin Video Visit. Be sure to Select Allow for your device to access the Microphone and Camera for your visit. You will then be connected, and your provider will be with you shortly.  **If they have any issues connecting, or need assistance please contact MyChart service desk [phone])**  **If using a computer, in order to ensure the best quality for their visit they will need to use either of the following Internet Browsers: [employer], or Google Chrome**  IF USING DOXIMITY or DOXY.ME - The patient will receive a link just prior to their visit by text.     FULL LENGTH CONSENT FOR TELE-HEALTH VISIT   I hereby voluntarily request, consent and authorize [facility] and its employed or contracted physicians, physician assistants, nurse practitioners or other licensed health care professionals (the Practitioner), to provide me with telemedicine health care services (the "Services") as deemed necessary by the treating Practitioner. I acknowledge and consent to receive the Services by the Practitioner via telemedicine. I understand that the telemedicine visit will involve communicating with the Practitioner through live audiovisual communication technology and the disclosure of certain medical information by electronic transmission. I acknowledge that I have been given the opportunity to request an in-person assessment or other available alternative prior to the telemedicine visit and am voluntarily participating in the telemedicine visit.  I understand that I have the right to withhold or withdraw my consent to the use of telemedicine in the course of my care at any time, without affecting my right  to future care or treatment, and that the Practitioner or I may terminate the telemedicine visit at any time. I understand that I have the right to inspect all information obtained and/or recorded in the course of the telemedicine visit and may receive copies of available information for a reasonable fee.  I understand that some of the potential risks of receiving the Services via telemedicine include:  Marland Kitchen Delay or interruption in medical evaluation due to technological equipment failure or disruption; . Information transmitted may not be sufficient (e.g. poor resolution of images) to allow for appropriate medical decision making by the Practitioner; and/or  . In rare instances, security protocols could fail, causing a breach of personal health information.  Furthermore, I acknowledge that it is my responsibility to provide information about my medical history, conditions and care that is complete and accurate to the best of my ability. I acknowledge that Practitioner's advice, recommendations, and/or decision may be based on factors not within their control, such as incomplete or inaccurate data provided by me or distortions of diagnostic images or specimens that may result from  electronic transmissions. I understand that the practice of medicine is not an exact science and that Practitioner makes no warranties or guarantees regarding treatment outcomes. I acknowledge that I will receive a copy of this consent concurrently upon execution via email to the email address I last provided but may also request a printed copy by calling the office of [facility].    I understand that my insurance will be billed for this visit.   I have read or had this consent read to me. . I understand the contents of this consent, which adequately explains the benefits and risks of the Services being provided via telemedicine.  . I have been provided ample opportunity to ask questions regarding this consent and the Services  and have had my questions answered to my satisfaction. . I give my informed consent for the services to be provided through the use of telemedicine in my medical care  By participating in this telemedicine visit I agree to the above.

## 2018-05-03 NOTE — Progress Notes (Signed)
Telehealth Visit     Virtual Visit via Video Note   This visit type was conducted due to national recommendations for restrictions regarding the COVID-19 Pandemic (e.g. social distancing) in an effort to limit this patient's exposure and mitigate transmission in our community.  Due to his co-morbid illnesses, this patient is at least at moderate risk for complications without adequate follow up.  This format is felt to be most appropriate for this patient at this time.  All issues noted in this document were discussed and addressed.  A limited physical exam was performed with this format.  Please refer to the patient's chart for his consent to telehealth for [facility].   Evaluation Performed:  Follow-up visit  This visit type was conducted due to national recommendations for restrictions regarding the COVID-19 Pandemic (e.g. social distancing).  This format is felt to be most appropriate for this patient at this time.  All issues noted in this document were discussed and addressed.  No physical exam was performed (except for noted visual exam findings with Video Visits).  Please refer to the patient's chart (MyChart message for video visits and phone note for telephone visits) for the patient's consent to telehealth for [facility].  Date:  05/04/2018   ID:  [name], DOB 06-Feb-1950, MRN [id number]  Patient Location:  Home  Provider location:   Home  PCP:  Deland Pretty, MD  Cardiologist:  [facility] Electrophysiologist:  None   Chief Complaint:  Follow up visit.   History of Present Illness:    MYKA LUKINS is a 68 y.o. male who presents via audio/video conferencing for a telehealth visit today.  Seen for Dr. Rayann Heman. Former patient of Dr. Susa Simmonds.Primarily sees me.  He has a history of HTN, HLD, CAD, obesity, thoracic aneurysm (yet not by CT 03/2017) and gout.Does have bifascicular block.Remote cath with 2 vessel CAD. Remote cath 11/26/2008 revealed  normal LM, 50-60% narrowing in the septal perforating branch of LAD with 20-30% narrowing of D1. LCX has irregularities. RCA had sequential 20-30% irregularities in its proximal portion. No significant focal disease.  Admitted back in December of 2017 - hadatypical chest pain - negative evaluation. Magnesium level severely low at 0.8 on admission along with his potassium - both were replaced - felt to be related to chronic PPI therapy. Echo and Myoview completed. Myoview with large defect - nowall motion abnormality however on echo. I then saw him in follow up- he was going to proceed on with getting his gallbladder out. CV risk factor modification was strongly encouraged.   I then saw him in [place] 2013for a work in visit - he had had a carotid doppler study - has less than 50% bilateral stenosis. He was having neck pain, ear pain and a headache. Noted that his BP was up. I increased his ACE and asked him to monitor his BP at home. Seenin Aprilof 2018- BP still up. Lots of salt. Loves Coke. Added more antihypertensives. Did not get the feeling that he had the motivation to follow thru with life style modification but when I saw him back in September of 2018 - he was doing quite well. Had lost 30 pounds but has gained some back since. Last visit with me back in 09/2017. Overall he was felt to be stable but BP was creeping up - there was some discrepancy in his BP readings between his arms - he was to monitor at home and had upcoming OV with his PCP  as well.   The patient does not have symptoms concerning for COVID-19 infection (fever, chills, cough, or new shortness of breath).   Seen today via Doximity video. He has consented for this visit. He is doing well. No real concerns on his part. BP is good. No chest pain. Breathing is ok. Weight is stable - not higher or lower. He has not been able to go dance - that was his social and exercise activity prior to the shut down. He notes he is  "battling the bulge"i.e., weight. Not really getting outside other than to do his yard work. He saw Dr. Shelia Media in January and had a good report.     Past Medical History:  Diagnosis Date  . Abnormal stress test    H/O  . Atypical chest pain   . Diverticulosis    H/O  . GERD (gastroesophageal reflux disease)   . Hyperlipidemia   . Hypertension   . Obesity   . RBBB    Past Surgical History:  Procedure Laterality Date  . CARDIAC CATHETERIZATION  11/26/08   EF 55%  WITH DR. Doreatha Lew  . COLONOSCOPY  03/01/07   W/POLYPECTOMY AND BX  . GALLBLADDER SURGERY    . PERCUTANEOUS PINNING PHALANX FRACTURE OF HAND       Current Meds  Medication Sig  . albuterol (VENTOLIN HFA) 108 (90 Base) MCG/ACT inhaler Inhale 2 puffs into the lungs every 6 (six) hours as needed for wheezing or shortness of breath (takes after mowing grass).  Marland Kitchen allopurinol (ZYLOPRIM) 300 MG tablet Take 300 mg by mouth daily.  Marland Kitchen amLODipine (NORVASC) 5 MG tablet Take 5 mg by mouth daily.  Marland Kitchen aspirin EC 81 MG tablet Take 81 mg by mouth daily.  . Cholecalciferol (VITAMIN D3 PO) Take 400 Units by mouth 2 (two) times a day.  . colchicine 0.6 MG tablet Take 0.6 mg by mouth daily. Only take if gout flare up  . dorzolamide (TRUSOPT) 2 % ophthalmic solution Place 1 drop into the right eye 2 (two) times daily.  Marland Kitchen ezetimibe (ZETIA) 10 MG tablet Take 10 mg by mouth daily.  . fluocinonide cream (LIDEX) 1.44 % Apply 1 application topically 2 (two) times daily as needed (dry skin).   . fluticasone (FLONASE) 50 MCG/ACT nasal spray Place 1 spray into both nostrils as needed for allergies or rhinitis (congestion).  . magnesium oxide (MAG-OX) 400 MG tablet Take 400 mg by mouth 2 (two) times daily.  Marland Kitchen omeprazole (PRILOSEC) 40 MG capsule Take 40 mg by mouth every other day.   Marland Kitchen OVER THE COUNTER MEDICATION Take 1 tablet by mouth daily. Macular Degeneration Supplement  . rosuvastatin (CRESTOR) 20 MG tablet Take 20 mg by mouth daily.  Marland Kitchen telmisartan  (MICARDIS) 80 MG tablet Take 80 mg by mouth daily.  . [DISCONTINUED] Magnesium Oxide 400 (240 Mg) MG TABS Take 2 tablets (800 mg total) by mouth daily. (Patient taking differently: Take 1 tablet by mouth 2 (two) times a day. )     Allergies:   Ace inhibitors; Aspirin; Benicar [olmesartan medoxomil]; and Other   Social History   Tobacco Use  . Smoking status: Never Smoker  . Smokeless tobacco: Never Used  Substance Use Topics  . Alcohol use: No  . Drug use: No     Family Hx: The patient's family history includes Arrhythmia in his mother; Atrial fibrillation in his mother; Sudden death (age of onset: 19) in his father.  ROS:   Please see the history of  present illness.   All other systems reviewed are negative.    Objective:    Vital Signs:  BP 119/84   Pulse 66   Ht 5\' 8"  (1.727 m)   Wt 175 lb (79.4 kg)   BMI 26.61 kg/m    Wt Readings from Last 3 Encounters:  05/04/18 175 lb (79.4 kg)  09/25/17 175 lb 6.4 oz (79.6 kg)  03/13/17 174 lb 12.8 oz (79.3 kg)    Alert male in no acute distress. He is not short of breath with conversation. He is appropriate in responses.    Labs/Other Tests and Data Reviewed:    Lab Results  Component Value Date   WBC 6.8 03/13/2017   HGB 14.1 03/13/2017   HCT 42.0 03/13/2017   PLT 231 03/13/2017   GLUCOSE 99 03/13/2017   CHOL 118 03/13/2017   TRIG 43 03/13/2017   HDL 52 03/13/2017   LDLCALC 57 03/13/2017   ALT 14 03/13/2017   AST 19 03/13/2017   NA 143 03/13/2017   K 4.5 03/13/2017   CL 107 (H) 03/13/2017   CREATININE 1.18 03/13/2017   BUN 12 03/13/2017   CO2 22 03/13/2017   INR 0.94 06/13/2009     BNP (last 3 results) No results for input(s): BNP in the last 8760 hours.  ProBNP (last 3 results) No results for input(s): PROBNP in the last 8760 hours.    Prior CV studies:    The following studies were reviewed today:  Carotid Doppler Final Interpretation 03/2017: Right Carotid: Velocities in the right ICA are  consistent with a 1-39% stenosis.                The RICA velocities remain within normal range and stable                compared to the prior exam.  Left Carotid: Velocities in the left ICA are consistent with a 1-39% stenosis.               The LICA velocities remain within normal range and stable compared               to the prior exam.  Vertebrals:  Bilateral vertebral arteries demonstrate antegrade flow. Subclavians: Normal flow hemodynamics were seen in bilateral subclavian              arteries.  *See table(s) above for measurements and observations.   Electronically signed by Carlyle Dolly on 03/16/2017 at 4:07:17 PM.   CTA CHEST 03/2017 IMPRESSION: 1. Nonaneurysmal ascending thoracic aorta, measuring up to 3.6 cm in maximal diameter. 2. Aortic atherosclerosis (ICD10-I70.0).   Electronically Signed By: Titus Dubin M.D. On: 03/21/2017 10:20  On: 02/25/2016 15:59   Echo Study Conclusions 01/2016  - Left ventricle: The cavity size was normal. There was mild focal basal hypertrophy of the septum. Systolic function was normal. The estimated ejection fraction was in the range of 55% to 60%. Wall motion was normal; there were no regional wall motion abnormalities. Doppler parameters are consistent with abnormal left ventricular relaxation (grade 1 diastolic dysfunction). - Aortic valve: Trileaflet; mildly thickened, mildly calcified leaflets. - Aorta: Ascending aortic diameter: 38 mm (S). - Ascending aorta: The ascending aorta was mildly dilated. - Mitral valve: There was mild regurgitation. - Left atrium: The atrium was mildly dilated.   MYOVIEW IMPRESSION 12/2015: 1. Large fixed defect in the inferior wall, apex, and adjacent lateral and septal regions.   2. Normal left ventricular wall motion.  3.  Left ventricular ejection fraction 59%  4. Non invasive risk stratification*: Low risk.  *2012 Appropriate Use Criteria  for Coronary Revascularization Focused Update: J Am Coll Cardiol. 0355;97(4):163-845. http://content.airportbarriers.com.aspx?articleid=[email or web address]   Electronically Signed By: Marybelle Killings M.D. On: 12/26/2015 12:01   ASSESSMENT & PLAN:    1. Known CAD - low risk Myoviewfrom December 2017noted - did have large defect but stable echo - he has been managed medically since that time. He has no active symptoms. CV risk factor modification encouraged.   2. HTN - normal flow hemodynamics to the subclavians noted on prior carotid doppler study - BP looks good today - no changes have been made.   3. HLD - he remains on statin therapy  4. Obesity -always a struggle for him - encouraged him to try and go outside and do some walking.   5. Mildly dilated ascending aorta noted on echo from 01/2016 -this was NOT demonstrated on CT from 03/2017. Not discussed today.   6. Hypokalemia/hypomagnesium - probably related to his PPI therapy -havesuggestedseeing GI - have deferred thisto his PCP. Long term use of PPIstillnot recommended.He notes intolerance to fatty foods and that he should watch his diet more.   7. Known Bifascicular block - not on any AV nodal agents. No syncopereported. No reports of being lightheaded or dizzy.   8. Carotid disease - study stable from 03/2017 - has stable 1 to 39% bilateral disease - would favor updating in 2021.  9. COVID-19 Education: The signs and symptoms of COVID-19 were discussed with the patient and how to seek care for testing (follow up with PCP or arrange E-visit).  The importance of social distancing, staying at home, hand hygiene and wearing a mask when out in public were discussed today.  Patient Risk:   After full review of this patient's clinical status, I feel that they are at least moderate risk at this time.  Time:   Today, I have spent 10 minutes with the patient with telehealth technology discussing the above issues.      Medication Adjustments/Labs and Tests Ordered: Current medicines are reviewed at length with the patient today.  Concerns regarding medicines are outlined above.   Tests Ordered: No orders of the defined types were placed in this encounter.   Medication Changes: No orders of the defined types were placed in this encounter.   Disposition:  FU with me in 6 months.   Patient is agreeable to this plan and will call if any problems develop in the interim.   Amie Critchley, NP  05/04/2018 8:17 AM    [place]

## 2018-05-04 ENCOUNTER — Telehealth (INDEPENDENT_AMBULATORY_CARE_PROVIDER_SITE_OTHER): Payer: Medicare Other | Admitting: Nurse Practitioner

## 2018-05-04 ENCOUNTER — Encounter: Payer: Self-pay | Admitting: Nurse Practitioner

## 2018-05-04 ENCOUNTER — Other Ambulatory Visit: Payer: Self-pay

## 2018-05-04 VITALS — BP 119/84 | HR 66 | Ht 68.0 in | Wt 175.0 lb

## 2018-05-04 DIAGNOSIS — I259 Chronic ischemic heart disease, unspecified: Secondary | ICD-10-CM

## 2018-05-04 DIAGNOSIS — I10 Essential (primary) hypertension: Secondary | ICD-10-CM

## 2018-05-04 DIAGNOSIS — Z7189 Other specified counseling: Secondary | ICD-10-CM

## 2018-05-04 DIAGNOSIS — E7800 Pure hypercholesterolemia, unspecified: Secondary | ICD-10-CM

## 2018-05-04 NOTE — Patient Instructions (Addendum)
After Visit Summary:  We will be checking the following labs today - NONE   Medication Instructions:    Continue with your current medicines.    If you need a refill on your cardiac medications before your next appointment, please call your pharmacy.     Testing/Procedures To Be Arranged:  N/A  Follow-Up:   See me in 6 months.     At [facility], you and your health needs are our priority.  As part of our continuing mission to provide you with exceptional heart care, we have created designated Provider Care Teams.  These Care Teams include your primary Cardiologist (physician) and Advanced Practice Providers (APPs -  Physician Assistants and Nurse Practitioners) who all work together to provide you with the care you need, when you need it.  Special Instructions:  . Stay safe, stay home, wash your hands for at least 20 seconds and wear a mask when out in public.  . It was good to talk with you today.  . Try to get outside and walk each day . Keep a check on your BP for me.  . Overall, I think you are doing well.    Call the [place] office at [phone] if you have any questions, problems or concerns.

## 2018-05-23 DIAGNOSIS — I10 Essential (primary) hypertension: Secondary | ICD-10-CM | POA: Diagnosis not present

## 2018-06-13 IMAGING — US US ABDOMEN LIMITED
1 series · 14 of 25 positions shown · non-contrast
Comparison: None ; correlation CT abdomen 09/19/2008

CLINICAL DATA: Chest pain

EXAM:
US ABDOMEN LIMITED - RIGHT UPPER QUADRANT

[Series 1: us abdomen limited · 0.32mm/px · 14 of 33 slices shown]
[im 1/33]
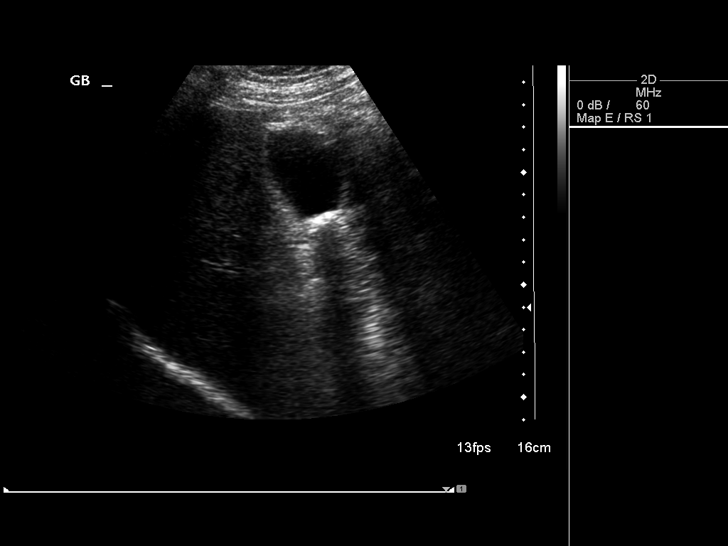
[im 3/33]
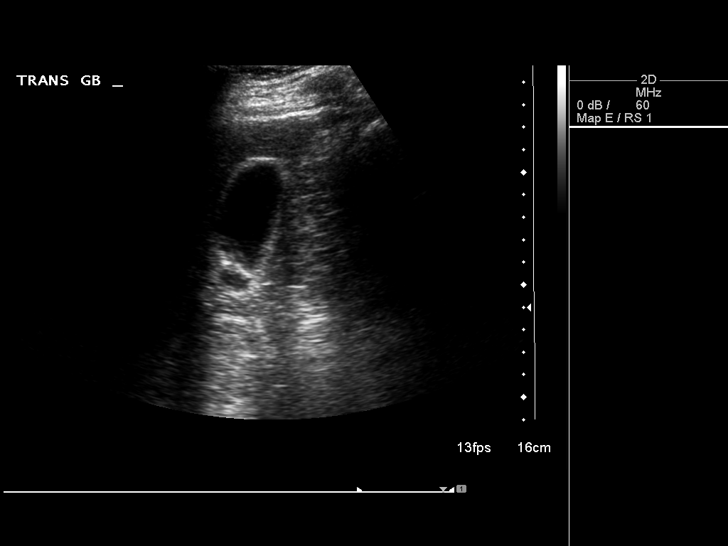
[im 6/33]
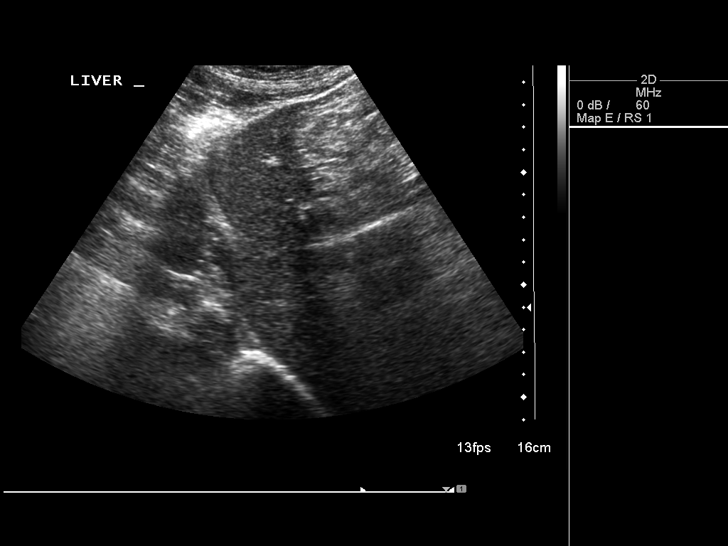
[im 9/33]
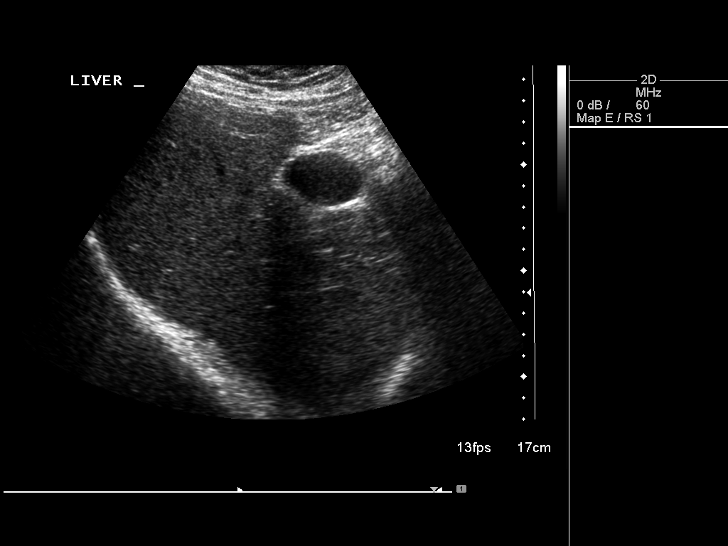
[im 11/33]
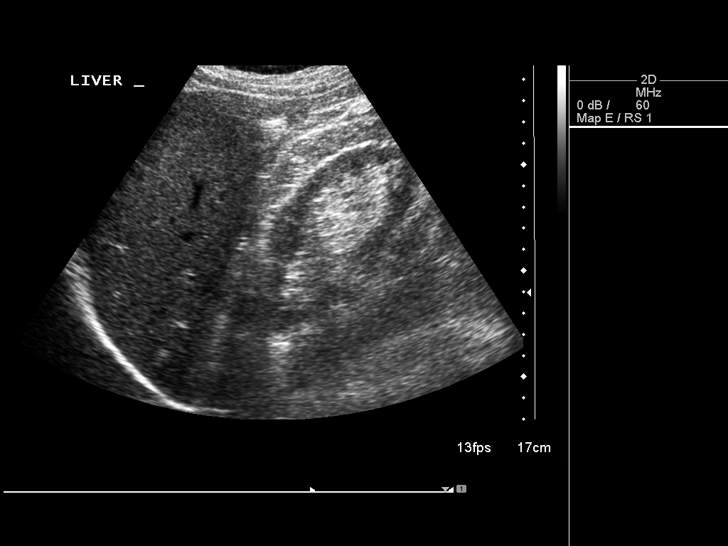
[im 13/33]
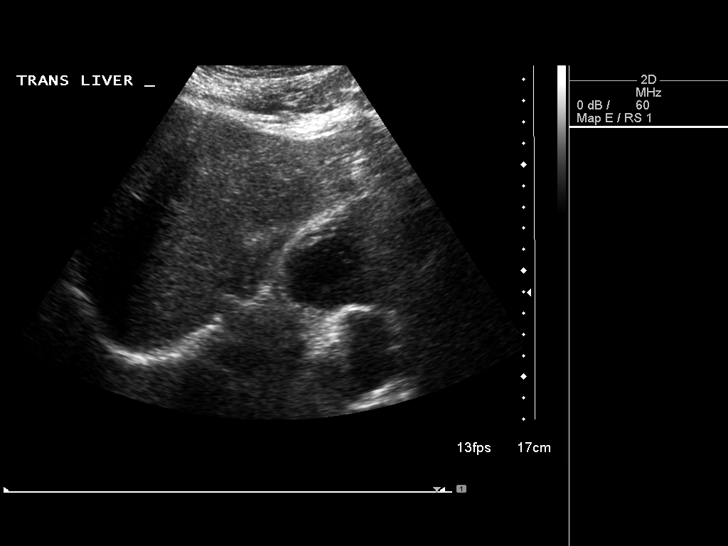
[im 15/33]
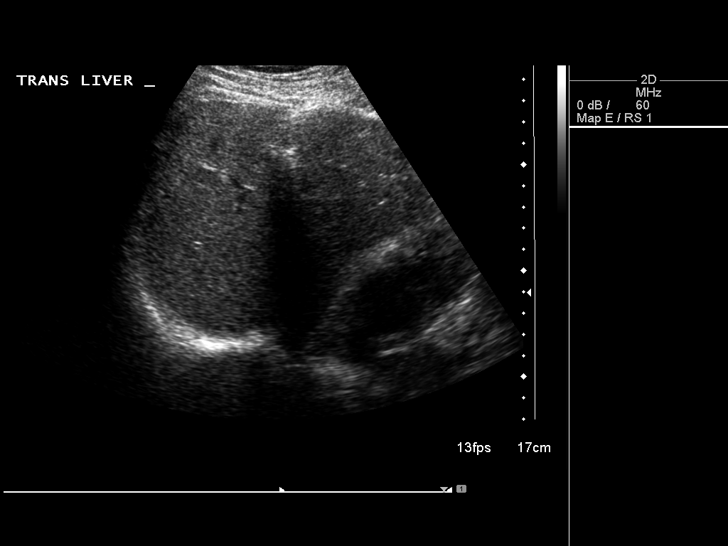
[im 18/33]
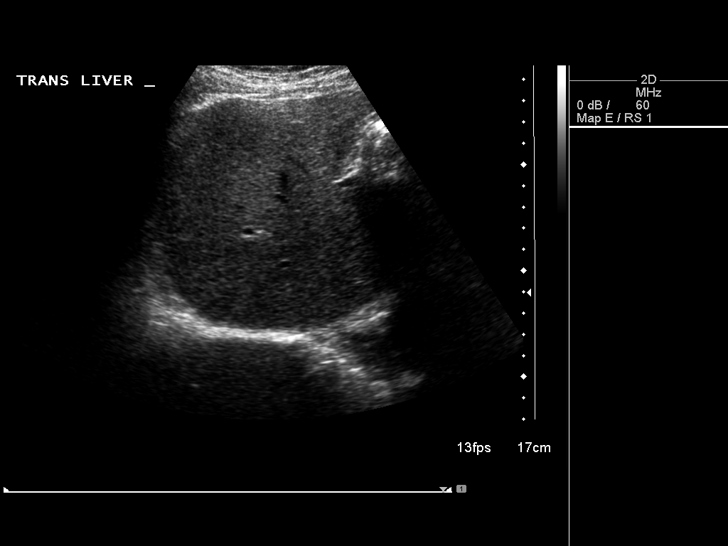
[im 21/33]
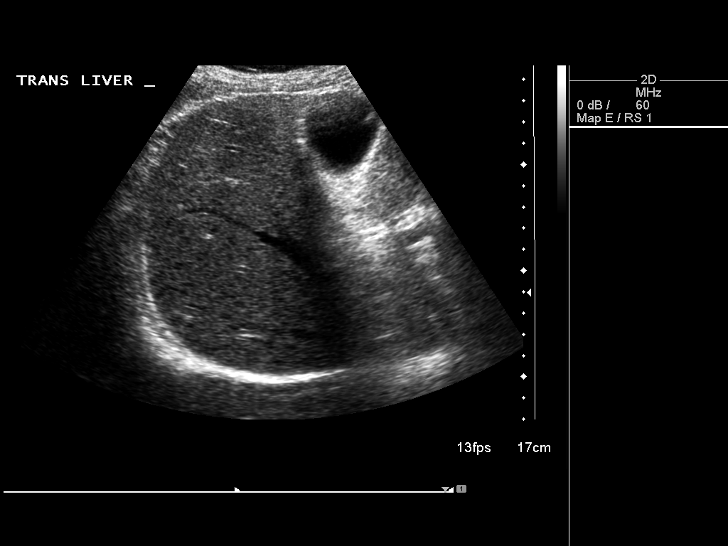
[im 22/33]
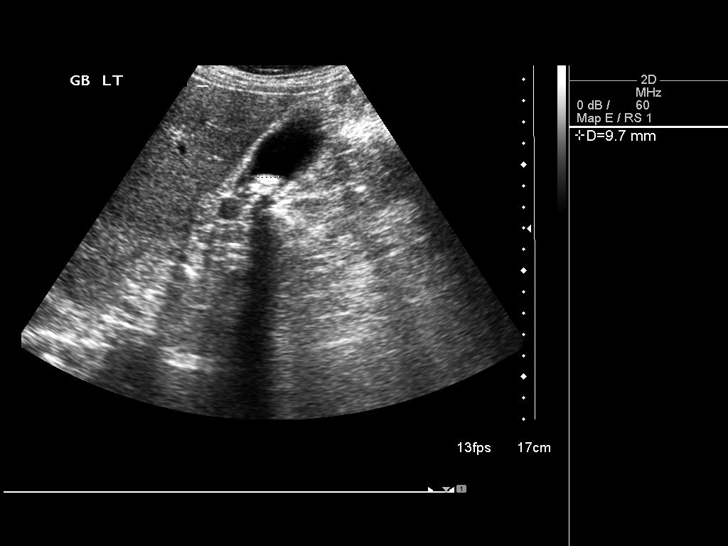
[im 25/33]
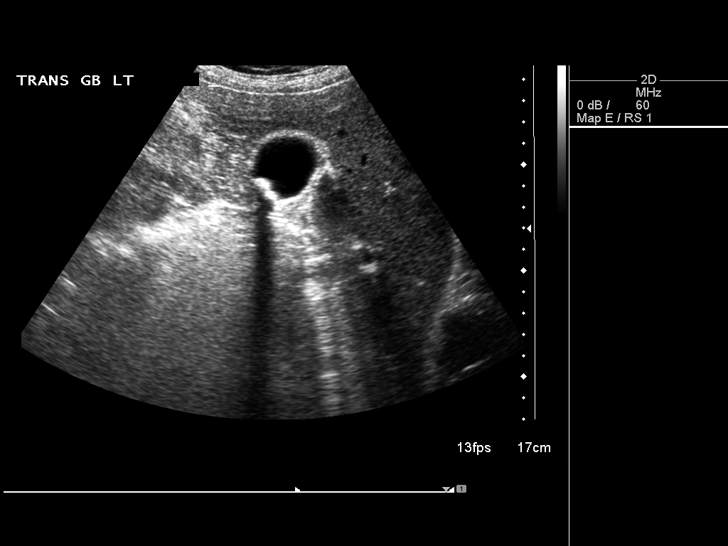
[im 27/33]
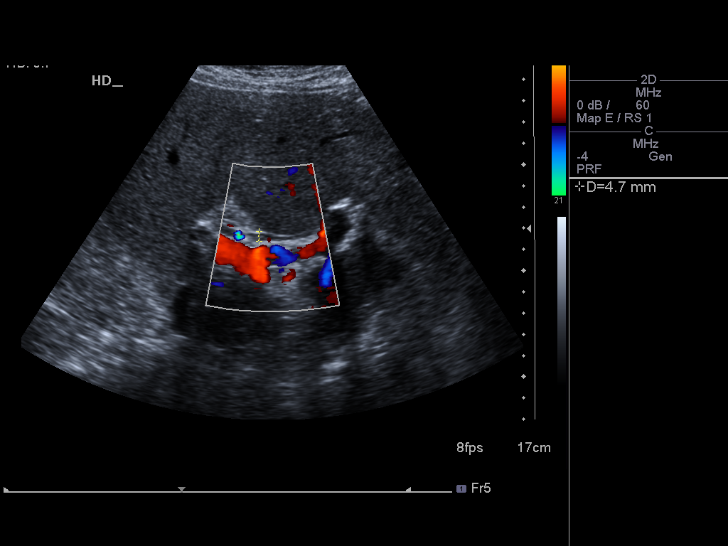
[im 30/33]
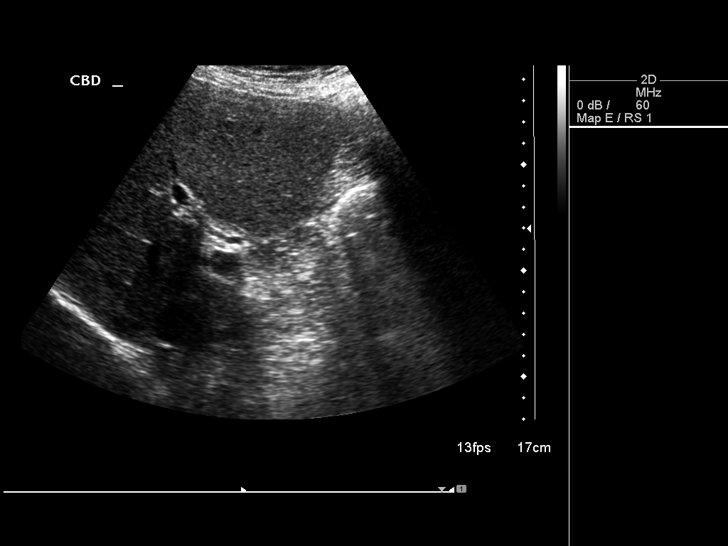
[im 33/33]
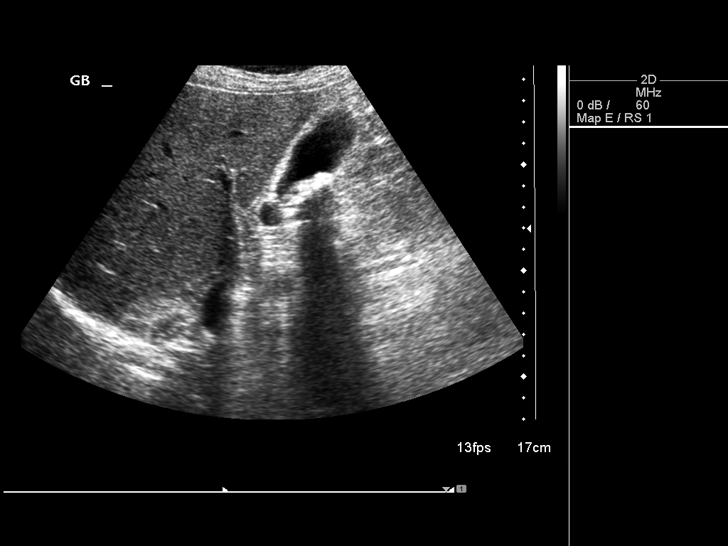

[14 of 25 positions shown; findings below may reference images not displayed]

FINDINGS: Gallbladder:

Dependent shadowing gallstones in gallbladder as noted on prior CT.
Largest stone 10 mm diameter. No gallbladder wall thickening,
pericholecystic fluid, or sonographic Murphy sign.

Common bile duct:

Diameter: Normal caliber 4 mm diameter

Liver:

Normal appearance

No RIGHT upper quadrant free fluid
IMPRESSION: Cholelithiasis without evidence acute cholecystitis or biliary
dilatation.

## 2018-07-26 DIAGNOSIS — H401112 Primary open-angle glaucoma, right eye, moderate stage: Secondary | ICD-10-CM | POA: Diagnosis not present

## 2018-07-26 DIAGNOSIS — H40012 Open angle with borderline findings, low risk, left eye: Secondary | ICD-10-CM | POA: Diagnosis not present

## 2018-08-09 DIAGNOSIS — H00025 Hordeolum internum left lower eyelid: Secondary | ICD-10-CM | POA: Diagnosis not present

## 2018-09-14 DIAGNOSIS — Z23 Encounter for immunization: Secondary | ICD-10-CM | POA: Diagnosis not present

## 2018-11-08 NOTE — Progress Notes (Signed)
CARDIOLOGY OFFICE NOTE  Date:  11/12/2018    James Olson Date of Birth: 1950/12/31 Medical Record [id number]  PCP:  Deland Pretty, MD  Cardiologist:  [place]  Chief Complaint  Patient presents with  . Follow-up    History of Present Illness: James Olson is a 68 y.o. male who presents today for a 6 month check. Seen for Dr. Rayann Heman. Former patient of Dr. Susa Simmonds.Primarily follows with me.  He has a history of HTN, HLD, CAD, obesity, mention of prior thoracic aneurysm (yet not noted by CT 03/2017)and gout.Does have bifascicular block.Remote cath with 2 vessel CAD. Remote cath 11/26/2008 revealed normal LM, 50-60% narrowing in the septal perforating branch of LAD with 20-30% narrowing of D1. LCX has irregularities. RCA had sequential 20-30% irregularities in its proximal portion. No significant focal disease.  Admitted back in December of 2017 - hadatypical chest pain - negative evaluation. Magnesium level severely low at 0.8 on admission along with his potassium - both were replaced - felt to be related to chronic PPI therapy. Echo and Myoview completed. Myoview with large defect - nowall motion abnormality however on echo. I then saw him in follow up- he was going to proceed on with getting his gallbladder out. CV risk factor modification was strongly encouraged.   Over the past few years he struggled with risk factor modification - he has had elevation in his BP and has had to have med changes. He has done better over the last few years and has been successful with weight loss.   We did a telehealth visit back in early May he was doing ok. Missing his social dancing which was also his form of exercise.   The patient does not have symptoms concerning for COVID-19 infection (fever, chills, cough, or new shortness of breath).   Comes in today. Here alone. He is doing well. Still misses his dancing. He is trying to walk - has more orthopedic  issues with a hip and knee. No chest pain. BP is ok. Not dizzy or lightheaded. No syncope. Breathing is good. Had a flu shot. He feels good on his medicines. No recent labs. Seeing PCP next month. Anxious for COVID vaccine.   Past Medical History:  Diagnosis Date  . Abnormal stress test    H/O  . Atypical chest pain   . Diverticulosis    H/O  . GERD (gastroesophageal reflux disease)   . Hyperlipidemia   . Hypertension   . Obesity   . RBBB     Past Surgical History:  Procedure Laterality Date  . CARDIAC CATHETERIZATION  11/26/08   EF 55%  WITH DR. Doreatha Lew  . COLONOSCOPY  03/01/07   W/POLYPECTOMY AND BX  . GALLBLADDER SURGERY    . PERCUTANEOUS PINNING PHALANX FRACTURE OF HAND       Medications: Current Meds  Medication Sig  . albuterol (VENTOLIN HFA) 108 (90 Base) MCG/ACT inhaler Inhale 2 puffs into the lungs every 6 (six) hours as needed for wheezing or shortness of breath (takes after mowing grass).  Marland Kitchen allopurinol (ZYLOPRIM) 300 MG tablet Take 300 mg by mouth daily.  Marland Kitchen amLODipine (NORVASC) 5 MG tablet Take 5 mg by mouth daily.  Marland Kitchen aspirin EC 81 MG tablet Take 81 mg by mouth daily.  . Cholecalciferol (VITAMIN D3 PO) Take 400 Units by mouth 2 (two) times a day.  . colchicine 0.6 MG tablet Take 0.6 mg by mouth daily. Only take if gout flare up  .  dorzolamide (TRUSOPT) 2 % ophthalmic solution Place 1 drop into the right eye 2 (two) times daily.  Marland Kitchen ezetimibe (ZETIA) 10 MG tablet Take 10 mg by mouth daily.  . fluocinonide cream (LIDEX) AB-123456789 % Apply 1 application topically 2 (two) times daily as needed (dry skin).   . fluticasone (FLONASE) 50 MCG/ACT nasal spray Place 1 spray into both nostrils as needed for allergies or rhinitis (congestion).  . magnesium oxide (MAG-OX) 400 MG tablet Take 400 mg by mouth 2 (two) times daily.  Marland Kitchen omeprazole (PRILOSEC) 40 MG capsule Take 40 mg by mouth every other day.   Marland Kitchen OVER THE COUNTER MEDICATION Take 1 tablet by mouth daily. Macular Degeneration  Supplement  . rosuvastatin (CRESTOR) 20 MG tablet Take 20 mg by mouth daily.  Marland Kitchen telmisartan (MICARDIS) 80 MG tablet Take 80 mg by mouth daily.     Allergies: Allergies  Allergen Reactions  . Ace Inhibitors Cough  . Aspirin Other (See Comments)    Can take enteric coated but not the regular. GI issues  . Benicar [Olmesartan Medoxomil] Cough  . Other     Preservative in some eye drops. Causes eyes to blister.     Social History: The patient  reports that he has never smoked. He has never used smokeless tobacco. He reports that he does not drink alcohol or use drugs.   Family History: The patient's family history includes Arrhythmia in his mother; Atrial fibrillation in his mother; Sudden death (age of onset: 39) in his father.   Review of Systems: Please see the history of present illness.   All other systems are reviewed and negative.   Physical Exam: VS:  BP 122/80   Pulse (!) 57   Ht 5\' 8"  (1.727 m)   Wt 172 lb (78 kg)   SpO2 99%   BMI 26.15 kg/m  .  BMI Body mass index is 26.15 kg/m.  Wt Readings from Last 3 Encounters:  11/12/18 172 lb (78 kg)  05/04/18 175 lb (79.4 kg)  09/25/17 175 lb 6.4 oz (79.6 kg)    General: Pleasant. Well developed, well nourished and in no acute distress.  Weight is down a few pounds.  HEENT: Normal.  Neck: Supple, no JVD, carotid bruits, or masses noted.  Cardiac: Regular rate and rhythm. Heart tones are distant.  No edema.  Respiratory:  Lungs are clear to auscultation bilaterally with normal work of breathing.  GI: Soft and nontender.  MS: No deformity or atrophy. Gait and ROM intact.  Skin: Warm and dry. Color is normal.  Neuro:  Strength and sensation are intact and no gross focal deficits noted.  Psych: Alert, appropriate and with normal affect.   LABORATORY DATA:  EKG:  EKG is ordered today. This demonstrates sinus bradycardia - HR is 57 - bifascicular block - unchanged.  Lab Results  Component Value Date   WBC 6.8  03/13/2017   HGB 14.1 03/13/2017   HCT 42.0 03/13/2017   PLT 231 03/13/2017   GLUCOSE 99 03/13/2017   CHOL 118 03/13/2017   TRIG 43 03/13/2017   HDL 52 03/13/2017   LDLCALC 57 03/13/2017   ALT 14 03/13/2017   AST 19 03/13/2017   NA 143 03/13/2017   K 4.5 03/13/2017   CL 107 (H) 03/13/2017   CREATININE 1.18 03/13/2017   BUN 12 03/13/2017   CO2 22 03/13/2017   INR 0.94 06/13/2009     BNP (last 3 results) No results for input(s): BNP in the last 8760  hours.  ProBNP (last 3 results) No results for input(s): PROBNP in the last 8760 hours.   Other Studies Reviewed Today:  Carotid Doppler Final Interpretation 03/2017: Right Carotid: Velocities in the right ICA are consistent with a 1-39% stenosis. The RICA velocities remain within normal range and stable compared to the prior exam.  Left Carotid: Velocities in the left ICA are consistent with a 1-39% stenosis. The LICA velocities remain within normal range and stable compared to the prior exam.  Vertebrals: Bilateral vertebral arteries demonstrate antegrade flow. Subclavians: Normal flow hemodynamics were seen in bilateral subclavian arteries.  *See table(s) above for measurements and observations.  Electronically signed by Carlyle Dolly on 03/16/2017 at 4:07:17 PM.   CTA CHEST 03/2017 IMPRESSION: 1. Nonaneurysmal ascending thoracic aorta, measuring up to 3.6 cm in maximal diameter. 2. Aortic atherosclerosis (ICD10-I70.0).   Electronically Signed By: Titus Dubin M.D. On: 03/21/2017 10:20  On: 02/25/2016 15:59   Echo Study Conclusions 01/2016  - Left ventricle: The cavity size was normal. There was mild focal basal hypertrophy of the septum. Systolic function was normal. The estimated ejection fraction was in the range of 55% to 60%. Wall motion was normal; there were no regional wall motion abnormalities.  Doppler parameters are consistent with abnormal left ventricular relaxation (grade 1 diastolic dysfunction). - Aortic valve: Trileaflet; mildly thickened, mildly calcified leaflets. - Aorta: Ascending aortic diameter: 38 mm (S). - Ascending aorta: The ascending aorta was mildly dilated. - Mitral valve: There was mild regurgitation. - Left atrium: The atrium was mildly dilated.   MYOVIEW IMPRESSION 12/2015: 1. Large fixed defect in the inferior wall, apex, and adjacent lateral and septal regions.   2. Normal left ventricular wall motion.  3. Left ventricular ejection fraction 59%  4. Non invasive risk stratification*: Low risk.  *2012 Appropriate Use Criteria for Coronary Revascularization Focused Update: J Am Coll Cardiol. N6492421. http://content.airportbarriers.com.aspx?articleid=1201161   Electronically Signed By: Marybelle Killings M.D. On: 12/26/2015 12:01   ASSESSMENT & PLAN:    1. Known CAD - last Myoview from 12/2015 - did have large defect but stable echo and we have managed medically. He continues to do well with no active symptoms. Doing well clinically. No changes made today.   2. HTN - BP is great - lower readings from home - feels good - no changes made today.   3. Carotid disease - plan to update in 2021 - he is aware  4. Bifascicular block - unchanged - no symptoms. On no AV nodal blocking agents.   5. Obesity - down a few more pounds.   6. HLD - on statin - needs labs  7. Mildly dilated ascending aorta noted on echo from 01/2016 -this was NOT demonstrated on CT from 03/2017. Not discussed today.   8. History of hypokalemia/hypomagnesium - probably from chronic PPI therapy - he has not wished to stop this in the past.   9. OA - he is going to discuss with PCP at visit next month.   10. COVID-19 Education: The signs and symptoms of COVID-19 were discussed with the patient and how to seek care for testing (follow up with PCP  or arrange E-visit).  The importance of social distancing, staying at home, hand hygiene and wearing a mask when out in public were discussed today.  Current medicines are reviewed with the patient today.  The patient does not have concerns regarding medicines other than what has been noted above.  The following changes have been made:  See above.  Labs/ tests ordered today include:    Orders Placed This Encounter  Procedures  . Basic metabolic panel  . CBC no Diff  . Hepatic function panel  . Lipid Profile  . TSH  . Magnesium  . EKG 12-Lead     Disposition:   FU with me in 6 months. Lab today. Overall, felt to be doing well.    Patient is agreeable to this plan and will call if any problems develop in the interim.   SignedTruitt Merle, NP  11/12/2018 8:35 AM  [facility] [address] [place], [place]  [address] Phone: [phone] Fax: [phone]

## 2018-11-12 ENCOUNTER — Encounter: Payer: Self-pay | Admitting: Nurse Practitioner

## 2018-11-12 ENCOUNTER — Ambulatory Visit (INDEPENDENT_AMBULATORY_CARE_PROVIDER_SITE_OTHER): Payer: Medicare Other | Admitting: Nurse Practitioner

## 2018-11-12 ENCOUNTER — Other Ambulatory Visit: Payer: Self-pay

## 2018-11-12 VITALS — BP 122/80 | HR 57 | Ht 68.0 in | Wt 172.0 lb

## 2018-11-12 DIAGNOSIS — I452 Bifascicular block: Secondary | ICD-10-CM

## 2018-11-12 DIAGNOSIS — I6523 Occlusion and stenosis of bilateral carotid arteries: Secondary | ICD-10-CM

## 2018-11-12 DIAGNOSIS — I10 Essential (primary) hypertension: Secondary | ICD-10-CM

## 2018-11-12 DIAGNOSIS — I259 Chronic ischemic heart disease, unspecified: Secondary | ICD-10-CM

## 2018-11-12 DIAGNOSIS — Z7189 Other specified counseling: Secondary | ICD-10-CM

## 2018-11-12 DIAGNOSIS — E7800 Pure hypercholesterolemia, unspecified: Secondary | ICD-10-CM

## 2018-11-12 NOTE — Patient Instructions (Addendum)
After Visit Summary:  We will be checking the following labs today - BMET, CBC, HPF, Lipids, TSH and Mag level   Medication Instructions:    Continue with your current medicines.    If you need a refill on your cardiac medications before your next appointment, please call your pharmacy.     Testing/Procedures To Be Arranged:  N/A  Follow-Up:   See me in 6 months    At [facility], you and your health needs are our priority.  As part of our continuing mission to provide you with exceptional heart care, we have created designated Provider Care Teams.  These Care Teams include your primary Cardiologist (physician) and Advanced Practice Providers (APPs -  Physician Assistants and Nurse Practitioners) who all work together to provide you with the care you need, when you need it.  Special Instructions:  . Stay safe, stay home, wash your hands for at least 20 seconds and wear a mask when out in public.  . It was good to talk with you today.    Call the [place] office at [phone] if you have any questions, problems or concerns.

## 2018-11-14 LAB — BASIC METABOLIC PANEL
BUN/Creatinine Ratio: 14 (ref 10–24)
BUN: 16 mg/dL (ref 8–27)
CO2: 19 mmol/L — ABNORMAL LOW (ref 20–29)
Calcium: 9.8 mg/dL (ref 8.6–10.2)
Chloride: 108 mmol/L — ABNORMAL HIGH (ref 96–106)
Creatinine, Ser: 1.11 mg/dL (ref 0.76–1.27)
GFR calc Af Amer: 77 mL/min/{1.73_m2} (ref >59)
GFR calc non Af Amer: 67 mL/min/{1.73_m2} (ref >59)
Glucose: 94 mg/dL (ref 65–99)
Potassium: 4.2 mmol/L (ref 3.5–5.2)
Sodium: 145 mmol/L — ABNORMAL HIGH (ref 134–144)

## 2018-11-14 LAB — CBC
Hematocrit: 39.4 % (ref 37.5–51.0)
Hemoglobin: 13.7 g/dL (ref 13.0–17.7)
MCH: 30.3 pg (ref 26.6–33.0)
MCHC: 34.8 g/dL (ref 31.5–35.7)
MCV: 87 fL (ref 79–97)
Platelets: 224 10*3/uL (ref 150–450)
RBC: 4.52 x10E6/uL (ref 4.14–5.80)
RDW: 13.6 % (ref 11.6–15.4)
WBC: 7.2 10*3/uL (ref 3.4–10.8)

## 2018-11-14 LAB — LIPID PANEL
Chol/HDL Ratio: 2.3 ratio (ref 0.0–5.0)
Cholesterol, Total: 107 mg/dL (ref 100–199)
HDL: 46 mg/dL (ref >39)
LDL Chol Calc (NIH): 45 mg/dL (ref 0–99)
Triglycerides: 79 mg/dL (ref 0–149)
VLDL Cholesterol Cal: 16 mg/dL (ref 5–40)

## 2018-11-14 LAB — HEPATIC FUNCTION PANEL
ALT: 11 IU/L (ref 0–44)
AST: 18 IU/L (ref 0–40)
Albumin: 4.5 g/dL (ref 3.8–4.8)
Alkaline Phosphatase: 75 IU/L (ref 39–117)
Bilirubin Total: 0.3 mg/dL (ref 0.0–1.2)
Bilirubin, Direct: 0.13 mg/dL (ref 0.00–0.40)
Total Protein: 6.2 g/dL (ref 6.0–8.5)

## 2018-11-14 LAB — MAGNESIUM: Magnesium: 1.9 mg/dL (ref 1.6–2.3)

## 2018-11-14 LAB — TSH: TSH: 3.07 u[IU]/mL (ref 0.450–4.500)

## 2018-12-06 DIAGNOSIS — Z125 Encounter for screening for malignant neoplasm of prostate: Secondary | ICD-10-CM | POA: Diagnosis not present

## 2018-12-06 DIAGNOSIS — I10 Essential (primary) hypertension: Secondary | ICD-10-CM | POA: Diagnosis not present

## 2018-12-11 DIAGNOSIS — J4520 Mild intermittent asthma, uncomplicated: Secondary | ICD-10-CM | POA: Diagnosis not present

## 2018-12-11 DIAGNOSIS — N200 Calculus of kidney: Secondary | ICD-10-CM | POA: Diagnosis not present

## 2018-12-11 DIAGNOSIS — E789 Disorder of lipoprotein metabolism, unspecified: Secondary | ICD-10-CM | POA: Diagnosis not present

## 2018-12-11 DIAGNOSIS — L2084 Intrinsic (allergic) eczema: Secondary | ICD-10-CM | POA: Diagnosis not present

## 2018-12-11 DIAGNOSIS — I6523 Occlusion and stenosis of bilateral carotid arteries: Secondary | ICD-10-CM | POA: Diagnosis not present

## 2018-12-11 DIAGNOSIS — M1A9XX0 Chronic gout, unspecified, without tophus (tophi): Secondary | ICD-10-CM | POA: Diagnosis not present

## 2018-12-11 DIAGNOSIS — Z0000 Encounter for general adult medical examination without abnormal findings: Secondary | ICD-10-CM | POA: Diagnosis not present

## 2018-12-11 DIAGNOSIS — K219 Gastro-esophageal reflux disease without esophagitis: Secondary | ICD-10-CM | POA: Diagnosis not present

## 2018-12-11 DIAGNOSIS — Z86010 Personal history of colonic polyps: Secondary | ICD-10-CM | POA: Diagnosis not present

## 2018-12-11 DIAGNOSIS — Z1212 Encounter for screening for malignant neoplasm of rectum: Secondary | ICD-10-CM | POA: Diagnosis not present

## 2018-12-11 DIAGNOSIS — I10 Essential (primary) hypertension: Secondary | ICD-10-CM | POA: Diagnosis not present

## 2018-12-11 DIAGNOSIS — I2510 Atherosclerotic heart disease of native coronary artery without angina pectoris: Secondary | ICD-10-CM | POA: Diagnosis not present

## 2018-12-17 DIAGNOSIS — K5730 Diverticulosis of large intestine without perforation or abscess without bleeding: Secondary | ICD-10-CM | POA: Diagnosis not present

## 2018-12-17 DIAGNOSIS — Z86010 Personal history of colonic polyps: Secondary | ICD-10-CM | POA: Diagnosis not present

## 2018-12-17 DIAGNOSIS — Z1211 Encounter for screening for malignant neoplasm of colon: Secondary | ICD-10-CM | POA: Diagnosis not present

## 2018-12-17 DIAGNOSIS — K648 Other hemorrhoids: Secondary | ICD-10-CM | POA: Diagnosis not present

## 2019-01-25 ENCOUNTER — Ambulatory Visit: Payer: Medicare Other | Attending: Internal Medicine

## 2019-01-25 DIAGNOSIS — Z23 Encounter for immunization: Secondary | ICD-10-CM | POA: Insufficient documentation

## 2019-01-25 NOTE — Progress Notes (Signed)
   Covid-19 Vaccination Clinic  Name:  James Olson    MRN: [id number] DOB: 06-Jul-1950  01/25/2019  James Olson was observed post Covid-19 immunization for 15 minutes without incidence. He was provided with Vaccine Information Sheet and instruction to access the V-Safe system.   James Olson was instructed to call 911 with any severe reactions post vaccine: Marland Kitchen Difficulty breathing  . Swelling of your face and throat  . A fast heartbeat  . A bad rash all over your body  . Dizziness and weakness    Immunizations Administered    Name Date Dose VIS Date Route   Pfizer COVID-19 Vaccine 01/25/2019  2:32 PM 0.3 mL 12/14/2018 Intramuscular   Manufacturer: [place]   Lot: [id number]   [place]: [id number]

## 2019-01-28 DIAGNOSIS — H40012 Open angle with borderline findings, low risk, left eye: Secondary | ICD-10-CM | POA: Diagnosis not present

## 2019-01-28 DIAGNOSIS — H353133 Nonexudative age-related macular degeneration, bilateral, advanced atrophic without subfoveal involvement: Secondary | ICD-10-CM | POA: Diagnosis not present

## 2019-01-28 DIAGNOSIS — H401112 Primary open-angle glaucoma, right eye, moderate stage: Secondary | ICD-10-CM | POA: Diagnosis not present

## 2019-01-28 DIAGNOSIS — H02122 Mechanical ectropion of right lower eyelid: Secondary | ICD-10-CM | POA: Diagnosis not present

## 2019-02-10 ENCOUNTER — Encounter (HOSPITAL_COMMUNITY): Payer: Self-pay

## 2019-02-10 ENCOUNTER — Ambulatory Visit (HOSPITAL_COMMUNITY)
Admission: EM | Admit: 2019-02-10 | Discharge: 2019-02-10 | Disposition: A | Discharge status: Home / Self Care | Payer: Medicare Other | Attending: Emergency Medicine | Admitting: Emergency Medicine

## 2019-02-10 ENCOUNTER — Other Ambulatory Visit: Payer: Self-pay

## 2019-02-10 DIAGNOSIS — Z87442 Personal history of urinary calculi: Secondary | ICD-10-CM | POA: Diagnosis not present

## 2019-02-10 DIAGNOSIS — R1031 Right lower quadrant pain: Secondary | ICD-10-CM | POA: Diagnosis not present

## 2019-02-10 LAB — POCT URINALYSIS DIP (DEVICE)
Bilirubin Urine: NEGATIVE
Glucose, UA: NEGATIVE mg/dL
Hgb urine dipstick: NEGATIVE
Leukocytes,Ua: NEGATIVE
Nitrite: NEGATIVE
Protein, ur: NEGATIVE mg/dL
Specific Gravity, Urine: >=1.03 (ref 1.005–1.030)
Urobilinogen, UA: 0.2 mg/dL (ref 0.0–1.0)
pH: 5 (ref 5.0–8.0)

## 2019-02-10 MED ORDER — CIPROFLOXACIN HCL 500 MG PO TABS: 500.0000 mg | ORAL_TABLET | Freq: Two times a day (BID) | ORAL | 0 refills | Status: DC

## 2019-02-10 NOTE — ED Provider Notes (Signed)
[facility]    CSN: [id number] Arrival date & time: 02/10/19  1217      History   Chief Complaint Chief Complaint  Patient presents with  . Groin Pain    HPI James Olson is a 69 y.o. male.   For 3 days now pt has intermit sharp pains into rt side groin testicle area. States that its intermit worse when sitting down. Denies any pain upon voiding, unknown of blood in urine. Does have a hx of kidney stones. No abd pain. No fevers. No back pain .      Past Medical History:  Diagnosis Date  . Abnormal stress test    H/O  . Atypical chest pain   . Diverticulosis    H/O  . GERD (gastroesophageal reflux disease)   . Hyperlipidemia   . Hypertension   . Obesity   . RBBB     Patient Active Problem List   Diagnosis Date Noted  . Chest pain 12/26/2015  . Hypocalcemia 12/26/2015  . Hypokalemia 12/26/2015  . Dyslipidemia 12/26/2015  . Essential hypertension 12/26/2015  . GERD (gastroesophageal reflux disease) 12/26/2015    Past Surgical History:  Procedure Laterality Date  . CARDIAC CATHETERIZATION  11/26/08   EF 55%  WITH DR. Doreatha Lew  . COLONOSCOPY  03/01/07   W/POLYPECTOMY AND BX  . GALLBLADDER SURGERY    . PERCUTANEOUS PINNING PHALANX FRACTURE OF HAND         Home Medications    Prior to Admission medications   Medication Sig Start Date End Date Taking? Authorizing Provider  albuterol (VENTOLIN HFA) 108 (90 Base) MCG/ACT inhaler Inhale 2 puffs into the lungs every 6 (six) hours as needed for wheezing or shortness of breath (takes after mowing grass).    [provider]  allopurinol (ZYLOPRIM) 300 MG tablet Take 300 mg by mouth daily.    [provider]  amLODipine (NORVASC) 5 MG tablet Take 5 mg by mouth daily.    [provider]  aspirin EC 81 MG tablet Take 81 mg by mouth daily.    [provider]  Cholecalciferol (VITAMIN D3 PO) Take 400 Units by mouth 2 (two) times a day.    [provider]    colchicine 0.6 MG tablet Take 0.6 mg by mouth daily. Only take if gout flare up    [provider]  dorzolamide (TRUSOPT) 2 % ophthalmic solution Place 1 drop into the right eye 2 (two) times daily.    [provider]  ezetimibe (ZETIA) 10 MG tablet Take 10 mg by mouth daily.    [provider]  fluocinonide cream (LIDEX) AB-123456789 % Apply 1 application topically 2 (two) times daily as needed (dry skin).     [provider]  fluticasone (FLONASE) 50 MCG/ACT nasal spray Place 1 spray into both nostrils as needed for allergies or rhinitis (congestion).    [provider]  magnesium oxide (MAG-OX) 400 MG tablet Take 400 mg by mouth 2 (two) times daily.    [provider]  omeprazole (PRILOSEC) 40 MG capsule Take 40 mg by mouth every other day.     [provider]  OVER THE COUNTER MEDICATION Take 1 tablet by mouth daily. Macular Degeneration Supplement    [provider]  rosuvastatin (CRESTOR) 20 MG tablet Take 20 mg by mouth daily.    [provider]  telmisartan (MICARDIS) 80 MG tablet Take 80 mg by mouth daily. 08/31/17   [provider]  Family History Family History  Problem Relation Age of Onset  . Arrhythmia Mother         HAS PACEMAKER  . Atrial fibrillation Mother        PAF  . Sudden death Father 79    Social History Social History   Tobacco Use  . Smoking status: Never Smoker  . Smokeless tobacco: Never Used  Substance Use Topics  . Alcohol use: No  . Drug use: No     Allergies   Ace inhibitors, Aspirin, Benicar [olmesartan medoxomil], and Other   Review of Systems Review of Systems  Constitutional: Negative.   Respiratory: Negative.   Cardiovascular: Negative.   Gastrointestinal: Negative.   Genitourinary: Positive for testicular pain.       Rt side testicle , scrotum area   Skin: Negative.      Physical Exam Triage Vital Signs ED Triage Vitals  Enc Vitals Group      BP 02/10/19 1258 (!) 146/90     Pulse Rate 02/10/19 1258 64     Resp 02/10/19 1258 18     Temp 02/10/19 1258 97.9 F (36.6 C)     Temp Source 02/10/19 1258 Oral     SpO2 02/10/19 1258 98 %     Weight --      Height --      Head Circumference --      Peak Flow --      Pain Score 02/10/19 1259 5     Pain Loc --      Pain Edu? --      Excl. in [place]? --    No data found.  Updated Vital Signs BP (!) 146/90 (BP Location: Right Arm)   Pulse 64   Temp 97.9 F (36.6 C) (Oral)   Resp 18   SpO2 98%   Visual Acuity     Physical Exam Cardiovascular:     Rate and Rhythm: Normal rate.  Pulmonary:     Effort: Pulmonary effort is normal.  Abdominal:     General: Abdomen is flat.  Genitourinary:    Comments: Pain on palpation near rt groin area, no abd pain pt did not report and swelling to testicle area  Skin:    General: Skin is warm.  Neurological:     General: No focal deficit present.     Mental Status: He is alert.      UC Treatments / Results  Labs (all labs ordered are listed, but only abnormal results are displayed) Labs Reviewed - No data to display  EKG   Radiology No results found.  Procedures Procedures (including critical care time)  Medications Ordered in UC Medications - No data to display  Initial Impression / Assessment and Plan / UC Course  I have reviewed the triage vital signs and the nursing notes.  Pertinent labs & imaging results that were available during my care of the patient were reviewed by me and considered in my medical decision making (see chart for details).    We discussed that you may possibly have a kidney stone causing some of the pain into testicle groin area  You will need to have an ultrasound completed to diagnosis further. We do not have ultrasounds here. If pain continues and is uncontrolled you would need to be seen in the ER.  We will start you on medications to ensure no infection  Your urine we will send off for a  culture to ensure nothing further. They will call  you if any results are abnormal Final Clinical Impressions(s) / UC Diagnoses   Final diagnoses:  None   Discharge Instructions   None    ED Prescriptions    None     PDMP not reviewed this encounter.   Marney Setting, NP 02/10/19 1349

## 2019-02-10 NOTE — ED Triage Notes (Signed)
Pt present pain in his right testicle. Symptoms started 3 days ago. Pt states it just a sharp pain in his groin area.

## 2019-02-10 NOTE — Discharge Instructions (Addendum)
We discussed that you may possibly have a kidney stone causing some of the pain into testicle groin area  You will need to have an ultrasound completed to diagnosis further. We do not have ultrasounds here. If pain continues and is uncontrolled you would need to be seen in the ER.  We will start you on medications to ensure no infection  Your urine we will send off for a culture to ensure nothing further. They will call you if any results are abnormal

## 2019-02-11 DIAGNOSIS — N50811 Right testicular pain: Secondary | ICD-10-CM | POA: Diagnosis not present

## 2019-02-11 LAB — URINE CULTURE: Culture: NO GROWTH

## 2019-02-13 DIAGNOSIS — N451 Epididymitis: Secondary | ICD-10-CM | POA: Diagnosis not present

## 2019-02-15 ENCOUNTER — Ambulatory Visit: Payer: Medicare Other | Attending: Internal Medicine

## 2019-02-15 DIAGNOSIS — Z23 Encounter for immunization: Secondary | ICD-10-CM

## 2019-02-15 NOTE — Progress Notes (Signed)
   Covid-19 Vaccination Clinic  Name:  James Olson    MRN: [id number] DOB: 06/01/1950  02/15/2019  James Olson was observed post Covid-19 immunization for 15 minutes without incidence. He was provided with Vaccine Information Sheet and instruction to access the V-Safe system.   James Olson was instructed to call 911 with any severe reactions post vaccine: Marland Kitchen Difficulty breathing  . Swelling of your face and throat  . A fast heartbeat  . A bad rash all over your body  . Dizziness and weakness    Immunizations Administered    Name Date Dose VIS Date Route   Pfizer COVID-19 Vaccine 02/15/2019  9:19 AM 0.3 mL 12/14/2018 Intramuscular   Manufacturer: [place]   Lot: [id number]   [place]: [id number]

## 2019-04-19 DIAGNOSIS — H60502 Unspecified acute noninfective otitis externa, left ear: Secondary | ICD-10-CM | POA: Diagnosis not present

## 2019-04-30 DIAGNOSIS — H6692 Otitis media, unspecified, left ear: Secondary | ICD-10-CM | POA: Diagnosis not present

## 2019-05-08 NOTE — Progress Notes (Signed)
CARDIOLOGY OFFICE NOTE  Date:  05/13/2019    James Olson Date of Birth: 29-Apr-1950 Medical Record [id number]  PCP:  Deland Pretty, MD  Cardiologist:  [place]   Chief Complaint  Patient presents with  . Follow-up    Seen for Dr. Rayann Heman    History of Present Illness: James Olson is a 69 y.o. male who presents today for a follow up visit. Seen for Dr. Rayann Heman. Former patient of Dr. Susa Simmonds.Primarily follows with me.  He has a history of HTN, HLD, CAD, obesity, mention of prior thoracic aneurysm (yet not noted by CT 03/2017)and gout.Does have bifascicular block.Remote cath with 2 vessel CAD.Remote cath 11/26/2008 revealed normal LM, 50-60% narrowing in the septal perforating branch of LAD with 20-30% narrowing of D1. LCX has irregularities. RCA had sequential 20-30% irregularities in its proximal portion. No significant focal disease.  Admitted back in December of 2017 - hadatypical chest pain - negative evaluation. Magnesium level severely low at 0.8 on admission along with his potassium - both were replaced - felt to be related to chronic PPI therapy - which he has continued to take. Echo and Myoview completed. Myoview with large defect - NOwall motion abnormality however on echo. We elected to manage medically.  I then saw him in follow up- he was going to proceed on with getting his gallbladder out. CV risk factor modification was strongly encouraged.   Last seen back in November - the pandemic had been hard - he likes to dance for social interaction as well as exercise.   The patient does not have symptoms concerning for COVID-19 infection (fever, chills, cough, or new shortness of breath).   Comes in today. Here alone. He feels like he is doing well. He has been cleaning out his parents house. He is back dancing. BP is good. No chest pain. Not short of breath. Not dizzy or lightheaded. He feels like he is doing well. Tolerating his medicines.  Has been vaccinated.   Past Medical History:  Diagnosis Date  . Abnormal stress test    H/O  . Atypical chest pain   . Diverticulosis    H/O  . GERD (gastroesophageal reflux disease)   . Hyperlipidemia   . Hypertension   . Obesity   . RBBB     Past Surgical History:  Procedure Laterality Date  . CARDIAC CATHETERIZATION  11/26/08   EF 55%  WITH DR. Doreatha Lew  . COLONOSCOPY  03/01/07   W/POLYPECTOMY AND BX  . GALLBLADDER SURGERY    . PERCUTANEOUS PINNING PHALANX FRACTURE OF HAND       Medications: Current Meds  Medication Sig  . albuterol (VENTOLIN HFA) 108 (90 Base) MCG/ACT inhaler Inhale 2 puffs into the lungs every 6 (six) hours as needed for wheezing or shortness of breath (takes after mowing grass).  Marland Kitchen allopurinol (ZYLOPRIM) 300 MG tablet Take 300 mg by mouth daily.  Marland Kitchen amLODipine (NORVASC) 5 MG tablet Take 5 mg by mouth daily.  Marland Kitchen aspirin EC 81 MG tablet Take 81 mg by mouth daily.  . Cholecalciferol (VITAMIN D3 PO) Take 400 Units by mouth 2 (two) times a day.  . colchicine 0.6 MG tablet Take 0.6 mg by mouth daily. Only take if gout flare up  . dorzolamide (TRUSOPT) 2 % ophthalmic solution Place 1 drop into the right eye 2 (two) times daily.  Marland Kitchen ezetimibe (ZETIA) 10 MG tablet Take 10 mg by mouth daily.  . fluocinonide cream (LIDEX) 0.05 %  Apply 1 application topically 2 (two) times daily as needed (dry skin).   . fluticasone (FLONASE) 50 MCG/ACT nasal spray Place 1 spray into both nostrils as needed for allergies or rhinitis (congestion).  . magnesium oxide (MAG-OX) 400 MG tablet Take 400 mg by mouth 2 (two) times daily.  Marland Kitchen omeprazole (PRILOSEC) 40 MG capsule Take 40 mg by mouth every other day.   Marland Kitchen OVER THE COUNTER MEDICATION Take 1 tablet by mouth daily. Macular Degeneration Supplement  . rosuvastatin (CRESTOR) 20 MG tablet Take 20 mg by mouth daily.  Marland Kitchen telmisartan (MICARDIS) 80 MG tablet Take 80 mg by mouth daily.     Allergies: Allergies  Allergen Reactions  . Ace  Inhibitors Cough  . Aspirin Other (See Comments)    Can take enteric coated but not the regular. GI issues  . Benicar [Olmesartan Medoxomil] Cough  . Other     Preservative in some eye drops. Causes eyes to blister.     Social History: The patient  reports that he has never smoked. He has never used smokeless tobacco. He reports that he does not drink alcohol or use drugs.   Family History: The patient's family history includes Arrhythmia in his mother; Atrial fibrillation in his mother; Sudden death (age of onset: 51) in his father.   Review of Systems: Please see the history of present illness.   All other systems are reviewed and negative.   Physical Exam: VS:  BP 120/72   Pulse 63   Ht 5\' 8"  (1.727 m)   Wt 171 lb 12.8 oz (77.9 kg)   SpO2 95%   BMI 26.12 kg/m  .  BMI Body mass index is 26.12 kg/m.  Wt Readings from Last 3 Encounters:  05/13/19 171 lb 12.8 oz (77.9 kg)  11/12/18 172 lb (78 kg)  05/04/18 175 lb (79.4 kg)    General: Alert and in no acute distress.  Weight is down a few pounds.  HEENT: Normal.  Neck: Supple, no JVD, carotid bruits, or masses noted.  Cardiac: Regular rate and rhythm. No murmurs, rubs, or gallops. No edema.  Respiratory:  Lungs are clear to auscultation bilaterally with normal work of breathing.  GI: Soft and nontender.  MS: No deformity or atrophy. Gait and ROM intact.  Skin: Warm and dry. Color is normal.  Neuro:  Strength and sensation are intact and no gross focal deficits noted.  Psych: Alert, appropriate and with normal affect.   LABORATORY DATA:  EKG:  EKG is not ordered today.    Lab Results  Component Value Date   WBC 7.2 11/12/2018   HGB 13.7 11/12/2018   HCT 39.4 11/12/2018   PLT 224 11/12/2018   GLUCOSE 94 11/12/2018   CHOL 107 11/12/2018   TRIG 79 11/12/2018   HDL 46 11/12/2018   LDLCALC 45 11/12/2018   ALT 11 11/12/2018   AST 18 11/12/2018   NA 145 (H) 11/12/2018   K 4.2 11/12/2018   CL 108 (H) 11/12/2018     CREATININE 1.11 11/12/2018   BUN 16 11/12/2018   CO2 19 (L) 11/12/2018   TSH 3.070 11/12/2018   INR 0.94 06/13/2009     BNP (last 3 results) No results for input(s): BNP in the last 8760 hours.  ProBNP (last 3 results) No results for input(s): PROBNP in the last 8760 hours.   Other Studies Reviewed Today:  Carotid DopplerFinal Interpretation3/2019: Right Carotid: Velocities in the right ICA are consistent with a 1-39% stenosis. The RICA velocities  remain within normal range and stable compared to the prior exam.  Left Carotid: Velocities in the left ICA are consistent with a 1-39% stenosis. The LICA velocities remain within normal range and stable compared to the prior exam.  Vertebrals: Bilateral vertebral arteries demonstrate antegrade flow. Subclavians: Normal flow hemodynamics were seen in bilateral subclavian arteries.  *See table(s) above for measurements and observations.  Electronically signed by Carlyle Dolly on 03/16/2017 at 4:07:17 PM.   CTA CHEST 03/2017 IMPRESSION: 1. Nonaneurysmal ascending thoracic aorta, measuring up to 3.6 cm in maximal diameter. 2. Aortic atherosclerosis (ICD10-I70.0).   Electronically Signed By: Titus Dubin M.D. On: 03/21/2017 10:20  On: 02/25/2016 15:59   Echo Study Conclusions 01/2016  - Left ventricle: The cavity size was normal. There was mild focal basal hypertrophy of the septum. Systolic function was normal. The estimated ejection fraction was in the range of 55% to 60%. Wall motion was normal; there were no regional wall motion abnormalities. Doppler parameters are consistent with abnormal left ventricular relaxation (grade 1 diastolic dysfunction). - Aortic valve: Trileaflet; mildly thickened, mildly calcified leaflets. - Aorta: Ascending aortic diameter: 38 mm (S). - Ascending aorta: The ascending  aorta was mildly dilated. - Mitral valve: There was mild regurgitation. - Left atrium: The atrium was mildly dilated.   MYOVIEW IMPRESSION 12/2015: 1. Large fixed defect in the inferior wall, apex, and adjacent lateral and septal regions.   2. Normal left ventricular wall motion.  3. Left ventricular ejection fraction 59%  4. Non invasive risk stratification*: Low risk.  *2012 Appropriate Use Criteria for Coronary Revascularization Focused Update: J Am Coll Cardiol. N6492421. http://content.airportbarriers.com.aspx?articleid=1201161   Electronically Signed By: Marybelle Killings M.D. On: 12/26/2015 12:01   ASSESSMENT & PLAN:   1. Known CAD - last Myoview from 2017 - did have large defect but stable echo without wall motion abnormality - have managed medically - he continues to do well with no worrisome/active symptoms. No changes made - continue with CV risk factor modification. No beta blocker due to bifascicular block.   2. HTN - BP is fine here today - continue with Norvasc  3. HLD - on Zetia and Crestory - lab today.   4. Carotid disease - needs his duplex updated - will arrange.   5. Bifascicular block - no worrisome symptoms - not on AV nodal blocking agents.   6. Mildly dilated ascending aorta noted on echo from 01/2016 -this wasNOTdemonstrated on CT from 03/2017. Not discussed today.  7. History of hypokalemia/hypomagnesium - probably from chronic PPI therapy - he has not wished to stop this in the past. Not discussed today.   8. COVID-19 Education: The signs and symptoms of COVID-19 were discussed with the patient and how to seek care for testing (follow up with PCP or arrange E-visit).  The importance of social distancing, staying at home, hand hygiene and wearing a mask when out in public were discussed today. He has been vaccinated.   Current medicines are reviewed with the patient today.  The patient does not have concerns regarding  medicines other than what has been noted above.  The following changes have been made:  See above.  Labs/ tests ordered today include:    Orders Placed This Encounter  Procedures  . Basic metabolic panel  . CBC  . Hepatic function panel  . Lipid panel  . VAS US CAROTID     Disposition:   FU with me in 6 months. Lab today. Update carotid doppler study.  Patient is agreeable to this plan and will call if any problems develop in the interim.   SignedTruitt Merle, NP  05/13/2019 9:23 AM  [facility] [address] [place], [place]  [address] Phone: [phone] Fax: [phone]

## 2019-05-13 ENCOUNTER — Ambulatory Visit (INDEPENDENT_AMBULATORY_CARE_PROVIDER_SITE_OTHER): Payer: Medicare Other | Admitting: Nurse Practitioner

## 2019-05-13 ENCOUNTER — Telehealth: Payer: Self-pay | Admitting: *Deleted

## 2019-05-13 ENCOUNTER — Encounter: Payer: Self-pay | Admitting: Nurse Practitioner

## 2019-05-13 ENCOUNTER — Other Ambulatory Visit: Payer: Self-pay

## 2019-05-13 VITALS — BP 120/72 | HR 63 | Ht 68.0 in | Wt 171.8 lb

## 2019-05-13 DIAGNOSIS — Z7189 Other specified counseling: Secondary | ICD-10-CM

## 2019-05-13 DIAGNOSIS — I10 Essential (primary) hypertension: Secondary | ICD-10-CM

## 2019-05-13 DIAGNOSIS — E7800 Pure hypercholesterolemia, unspecified: Secondary | ICD-10-CM

## 2019-05-13 DIAGNOSIS — I6523 Occlusion and stenosis of bilateral carotid arteries: Secondary | ICD-10-CM

## 2019-05-13 DIAGNOSIS — I259 Chronic ischemic heart disease, unspecified: Secondary | ICD-10-CM

## 2019-05-13 LAB — LIPID PANEL
Chol/HDL Ratio: 2.5 ratio (ref 0.0–5.0)
Cholesterol, Total: 99 mg/dL — ABNORMAL LOW (ref 100–199)
HDL: 39 mg/dL — ABNORMAL LOW (ref >39)
LDL Chol Calc (NIH): 41 mg/dL (ref 0–99)
Triglycerides: 100 mg/dL (ref 0–149)
VLDL Cholesterol Cal: 19 mg/dL (ref 5–40)

## 2019-05-13 LAB — CBC
Hematocrit: 39.3 % (ref 37.5–51.0)
Hemoglobin: 13.7 g/dL (ref 13.0–17.7)
MCH: 29.7 pg (ref 26.6–33.0)
MCHC: 34.9 g/dL (ref 31.5–35.7)
MCV: 85 fL (ref 79–97)
Platelets: 209 10*3/uL (ref 150–450)
RBC: 4.61 x10E6/uL (ref 4.14–5.80)
RDW: 13.7 % (ref 11.6–15.4)
WBC: 6.4 10*3/uL (ref 3.4–10.8)

## 2019-05-13 LAB — BASIC METABOLIC PANEL
BUN/Creatinine Ratio: 16 (ref 10–24)
BUN: 18 mg/dL (ref 8–27)
CO2: 22 mmol/L (ref 20–29)
Calcium: 9.5 mg/dL (ref 8.6–10.2)
Chloride: 106 mmol/L (ref 96–106)
Creatinine, Ser: 1.13 mg/dL (ref 0.76–1.27)
GFR calc Af Amer: 76 mL/min/{1.73_m2} (ref >59)
GFR calc non Af Amer: 65 mL/min/{1.73_m2} (ref >59)
Glucose: 90 mg/dL (ref 65–99)
Potassium: 4.4 mmol/L (ref 3.5–5.2)
Sodium: 140 mmol/L (ref 134–144)

## 2019-05-13 LAB — HEPATIC FUNCTION PANEL
ALT: 15 IU/L (ref 0–44)
AST: 20 IU/L (ref 0–40)
Albumin: 4.5 g/dL (ref 3.8–4.8)
Alkaline Phosphatase: 76 IU/L (ref 39–117)
Bilirubin Total: 0.7 mg/dL (ref 0.0–1.2)
Bilirubin, Direct: 0.26 mg/dL (ref 0.00–0.40)
Total Protein: 6.3 g/dL (ref 6.0–8.5)

## 2019-05-13 NOTE — Patient Instructions (Addendum)
After Visit Summary:  We will be checking the following labs today - BMET, CBC, HPF and lipids   Medication Instructions:    Continue with your current medicines.    If you need a refill on your cardiac medications before your next appointment, please call your pharmacy.     Testing/Procedures To Be Arranged:  Carotid dopplers  Follow-Up:   See me in 6 months with fasting labs    At [facility], you and your health needs are our priority.  As part of our continuing mission to provide you with exceptional heart care, we have created designated Provider Care Teams.  These Care Teams include your primary Cardiologist (physician) and Advanced Practice Providers (APPs -  Physician Assistants and Nurse Practitioners) who all work together to provide you with the care you need, when you need it.  Special Instructions:  . Stay safe, stay home, wash your hands for at least 20 seconds and wear a mask when out in public.  . It was good to talk with you today.    Call the [place] office at [phone] if you have any questions, problems or concerns.

## 2019-05-13 NOTE — Telephone Encounter (Signed)
S/w pt is on way to ov appt with Truitt Merle, NP, pt thought appt was at 8:50.  Cecille Rubin will work pt in.

## 2019-05-23 ENCOUNTER — Other Ambulatory Visit: Payer: Self-pay

## 2019-05-23 ENCOUNTER — Ambulatory Visit (HOSPITAL_COMMUNITY)
Admission: RE | Admit: 2019-05-23 | Discharge: 2019-05-23 | Disposition: A | Discharge status: Home / Self Care | Payer: Medicare Other | Source: Ambulatory Visit | Attending: Cardiology | Admitting: Cardiology

## 2019-05-23 DIAGNOSIS — I6523 Occlusion and stenosis of bilateral carotid arteries: Secondary | ICD-10-CM | POA: Diagnosis not present

## 2019-07-30 DIAGNOSIS — H40012 Open angle with borderline findings, low risk, left eye: Secondary | ICD-10-CM | POA: Diagnosis not present

## 2019-07-30 DIAGNOSIS — H401112 Primary open-angle glaucoma, right eye, moderate stage: Secondary | ICD-10-CM | POA: Diagnosis not present

## 2019-09-14 DIAGNOSIS — Z20828 Contact with and (suspected) exposure to other viral communicable diseases: Secondary | ICD-10-CM | POA: Diagnosis not present

## 2019-09-16 DIAGNOSIS — J019 Acute sinusitis, unspecified: Secondary | ICD-10-CM | POA: Diagnosis not present

## 2019-09-16 DIAGNOSIS — J069 Acute upper respiratory infection, unspecified: Secondary | ICD-10-CM | POA: Diagnosis not present

## 2019-09-16 DIAGNOSIS — R05 Cough: Secondary | ICD-10-CM | POA: Diagnosis not present

## 2019-09-23 DIAGNOSIS — Z23 Encounter for immunization: Secondary | ICD-10-CM | POA: Diagnosis not present

## 2019-11-04 NOTE — Progress Notes (Signed)
CARDIOLOGY OFFICE NOTE  Date:  11/18/2019    James Olson Date of Birth: [birth year] Medical Record #[id number]  PCP:  Deland Pretty, MD  Cardiologist:  [place]  Chief Complaint  Patient presents with  . Follow-up    History of Present Illness: James Olson is a 69 y.o. male who presents today for a follow up visit. Seen for Dr. Rayann Heman. Former patient of Dr. Susa Simmonds.   He has a history of HTN, HLD, CAD, obesity,mention of priorthoracic aneurysm (yet notnotedby CT 03/2017)and gout.Does have chronic bifascicular block.Remote cath with 2 vessel CAD.Remote cath 11/26/2008 revealed normal LM, 50-60% narrowing in the septal perforating branch of LAD with 20-30% narrowing of D1. LCX has irregularities. RCA had sequential 20-30% irregularities in its proximal portion. No significant focal disease.  Admitted back in December of 2017 - hadatypical chest pain - negative evaluation. Magnesium level severely low at 0.8 on admission along with his potassium - both were replaced - felt to be related to chronic PPI therapy - which he has continued to take. Echo and Myoview completed. Myoview with large defect - NOwall motion abnormality however on echo. We elected to manage medically.  I then saw him in follow up- he was going to proceed on with getting his gallbladder out. CV risk factor modification was strongly encouraged.  The pandemic was hard for him - he likes to dance for social interaction as well as for exercise.   Last seen by me back in May - he was doing well - had been cleaning out his parents home. Back dancing. His cardiac status ok.   Comes in today. Here alone. Doing ok. No real issues. Notes that if has caffeine and then is active in a quick fashion - will feel his heart racing some - but otherwise ok and tries to avoid caffeine. He is dancing several times a week - cutting grass with push mower. No problems overall. Not dizzy or  lightheaded. No syncope. Asking about his Omeprazole today - would still favor trying to NOT take this. Needs labs. Tolerating his medicines. Overall he feels he is doing ok.   Past Medical History:  Diagnosis Date  . Abnormal stress test    H/O  . Atypical chest pain   . Diverticulosis    H/O  . GERD (gastroesophageal reflux disease)   . Hyperlipidemia   . Hypertension   . Obesity   . RBBB     Past Surgical History:  Procedure Laterality Date  . CARDIAC CATHETERIZATION  11/26/08   EF 55%  WITH DR. Doreatha Lew  . COLONOSCOPY  03/01/07   W/POLYPECTOMY AND BX  . GALLBLADDER SURGERY    . PERCUTANEOUS PINNING PHALANX FRACTURE OF HAND       Medications: Current Meds  Medication Sig  . albuterol (VENTOLIN HFA) 108 (90 Base) MCG/ACT inhaler Inhale 2 puffs into the lungs every 6 (six) hours as needed for wheezing or shortness of breath (takes after mowing grass).  Marland Kitchen allopurinol (ZYLOPRIM) 300 MG tablet Take 300 mg by mouth daily.  Marland Kitchen amLODipine (NORVASC) 5 MG tablet Take 5 mg by mouth daily.  Marland Kitchen aspirin EC 81 MG tablet Take 81 mg by mouth daily.  . Cholecalciferol (VITAMIN D3 PO) Take 400 Units by mouth 2 (two) times a day.  . colchicine 0.6 MG tablet Take 0.6 mg by mouth daily. Only take if gout flare up  . dorzolamide (TRUSOPT) 2 % ophthalmic solution Place 1 drop into  the right eye 2 (two) times daily.  Marland Kitchen ezetimibe (ZETIA) 10 MG tablet Take 10 mg by mouth daily.  . fluocinonide cream (LIDEX) 3.29 % Apply 1 application topically 2 (two) times daily as needed (dry skin).   . fluticasone (FLONASE) 50 MCG/ACT nasal spray Place 1 spray into both nostrils as needed for allergies or rhinitis (congestion).  . magnesium oxide (MAG-OX) 400 MG tablet Take 400 mg by mouth 2 (two) times daily.  . Magnesium Oxide 400 (240 Mg) MG TABS Take 1-2 tablets by mouth daily.  Marland Kitchen omeprazole (PRILOSEC) 40 MG capsule Take 40 mg by mouth every other day.   Marland Kitchen OVER THE COUNTER MEDICATION Take 1 tablet by mouth  daily. Macular Degeneration Supplement  . rosuvastatin (CRESTOR) 20 MG tablet Take 20 mg by mouth daily.  Marland Kitchen telmisartan (MICARDIS) 80 MG tablet Take 80 mg by mouth daily.     Allergies: Allergies  Allergen Reactions  . Ace Inhibitors Cough  . Aspirin Other (See Comments)    Can take enteric coated but not the regular. GI issues  . Benicar [Olmesartan Medoxomil] Cough  . Other     Preservative in some eye drops. Causes eyes to blister.     Social History: The patient  reports that he has never smoked. He has never used smokeless tobacco. He reports that he does not drink alcohol and does not use drugs.   Family History: The patient's family history includes Arrhythmia in his mother; Atrial fibrillation in his mother; Sudden death (age of onset: 47) in his father.   Review of Systems: Please see the history of present illness.   All other systems are reviewed and negative.   Physical Exam: VS:  BP 120/86   Pulse 61   Ht 5\' 8"  (1.727 m)   Wt 177 lb 3.2 oz (80.4 kg)   SpO2 95%   BMI 26.94 kg/m  .  BMI Body mass index is 26.94 kg/m.  Wt Readings from Last 3 Encounters:  11/18/19 177 lb 3.2 oz (80.4 kg)  05/13/19 171 lb 12.8 oz (77.9 kg)  11/12/18 172 lb (78 kg)    General: Alert and in no acute distress.   Cardiac: Regular rate and rhythm. No murmurs, rubs, or gallops. No edema.  Respiratory:  Lungs are clear to auscultation bilaterally with normal work of breathing.  GI: Soft and nontender.  MS: No deformity or atrophy. Gait and ROM intact.  Skin: Warm and dry. Color is normal.  Neuro:  Strength and sensation are intact and no gross focal deficits noted.  Psych: Alert, appropriate and with normal affect.   LABORATORY DATA:  EKG:  EKG is ordered today.  Personally reviewed by me. This demonstrates sinus rhythm with bifascicular block - unchanged - HR is 61.  Lab Results  Component Value Date   WBC 6.4 05/13/2019   HGB 13.7 05/13/2019   HCT 39.3 05/13/2019    PLT 209 05/13/2019   GLUCOSE 90 05/13/2019   CHOL 99 (L) 05/13/2019   TRIG 100 05/13/2019   HDL 39 (L) 05/13/2019   LDLCALC 41 05/13/2019   ALT 15 05/13/2019   AST 20 05/13/2019   NA 140 05/13/2019   K 4.4 05/13/2019   CL 106 05/13/2019   CREATININE 1.13 05/13/2019   BUN 18 05/13/2019   CO2 22 05/13/2019   TSH 3.070 11/12/2018   INR 0.94 06/13/2009     BNP (last 3 results) No results for input(s): BNP in the last 8760 hours.  ProBNP (last 3 results) No results for input(s): PROBNP in the last 8760 hours.   Other Studies Reviewed Today:  Carotid Doppler Summary 05/2019:  Right Carotid: Velocities in the right ICA are consistent with a 1-39%  stenosis.         Non-hemodynamically significant plaque <50% noted in the  CCA.   Left Carotid: Velocities in the left ICA are consistent with a 1-39%  stenosis.        Non-hemodynamically significant plaque <50% noted in the  CCA.   Vertebrals: Bilateral vertebral arteries demonstrate antegrade flow.  Subclavians: Normal flow hemodynamics were seen in bilateral subclavian        arteries.   *See table(s) above for measurements and observations.   Electronically signed by Jenkins Rouge MD on 05/24/2019 at 7:57:09 AM.      CTA CHEST 03/2017 IMPRESSION: 1. Nonaneurysmal ascending thoracic aorta, measuring up to 3.6 cm in maximal diameter. 2. Aortic atherosclerosis (ICD10-I70.0).   Electronically Signed By: Titus Dubin M.D. On: 03/21/2017 10:20  On: 02/25/2016 15:59   Echo Study Conclusions 01/2016  - Left ventricle: The cavity size was normal. There was mild focal basal hypertrophy of the septum. Systolic function was normal. The estimated ejection fraction was in the range of 55% to 60%. Wall motion was normal; there were no regional wall motion abnormalities. Doppler parameters are consistent with abnormal left ventricular relaxation (grade 1 diastolic  dysfunction). - Aortic valve: Trileaflet; mildly thickened, mildly calcified leaflets. - Aorta: Ascending aortic diameter: 38 mm (S). - Ascending aorta: The ascending aorta was mildly dilated. - Mitral valve: There was mild regurgitation. - Left atrium: The atrium was mildly dilated.   MYOVIEW IMPRESSION 12/2015: 1. Large fixed defect in the inferior wall, apex, and adjacent lateral and septal regions.   2. Normal left ventricular wall motion.  3. Left ventricular ejection fraction 59%  4. Non invasive risk stratification*: Low risk.  *2012 Appropriate Use Criteria for Coronary Revascularization Focused Update: J Am Coll Cardiol. 4034;74(2):595-638. http://content.airportbarriers.com.aspx?articleid=1201161   Electronically Signed By: Marybelle Killings M.D. On: 12/26/2015 12:01   ASSESSMENT & PLAN:   1. Known CAD - last Myoview from 2017 - did have a large defect but with stable echo without wall motion abnormality and Dr. Rayann Heman and I elected to follow him and continue medical management. He has no worrisome symptoms. Would continue with current regimen and CV risk factor modification.   2. Known bifascicular block - no worrisome symptoms. No indication for PPM at this point. Not on any rate slowing/AV nodal blocking agents.   3. HTN - BP looks good here today - no changes made.   4. GERD - chronic PPI - he is considering stopping this. I would be in favor of this.   5. HLD - on Zetia and Crestor - getting lab today.   6. Carotid disease - had his doppler study updated this past May - would favor rechecking in May of 2023.   7. Mildly dilated ascending aorta noted of echo from 2018 - not seen on CT from 2019 - not discussed.   8. History of hypokalemia and hypomagnesia - would favor stopping PPI - recheck lab today. He is on Mg replacement therapy.   Current medicines are reviewed with the patient today.  The patient does not have concerns regarding  medicines other than what has been noted above.  The following changes have been made:  See above.  Labs/ tests ordered today include:    Orders Placed  This Encounter  Procedures  . Basic metabolic panel  . CBC  . Hepatic function panel  . Magnesium  . Lipid panel  . EKG 12-Lead     Disposition:   FU with Dr. Rayann Heman in 6 months. He is aware that I am leaving in February. No changes made today.    Patient is agreeable to this plan and will call if any problems develop in the interim.   SignedTruitt Merle, NP  11/18/2019 8:35 AM  [facility] [address] [place], [place]  [address] Phone: [phone] Fax: [phone]

## 2019-11-18 ENCOUNTER — Ambulatory Visit (INDEPENDENT_AMBULATORY_CARE_PROVIDER_SITE_OTHER): Payer: Medicare Other | Admitting: Nurse Practitioner

## 2019-11-18 ENCOUNTER — Encounter: Payer: Self-pay | Admitting: Nurse Practitioner

## 2019-11-18 ENCOUNTER — Other Ambulatory Visit: Payer: Self-pay

## 2019-11-18 VITALS — BP 120/86 | HR 61 | Ht 68.0 in | Wt 177.2 lb

## 2019-11-18 DIAGNOSIS — E7800 Pure hypercholesterolemia, unspecified: Secondary | ICD-10-CM

## 2019-11-18 DIAGNOSIS — I259 Chronic ischemic heart disease, unspecified: Secondary | ICD-10-CM | POA: Diagnosis not present

## 2019-11-18 DIAGNOSIS — I452 Bifascicular block: Secondary | ICD-10-CM | POA: Diagnosis not present

## 2019-11-18 DIAGNOSIS — I10 Essential (primary) hypertension: Secondary | ICD-10-CM | POA: Diagnosis not present

## 2019-11-18 LAB — HEPATIC FUNCTION PANEL
ALT: 20 IU/L (ref 0–44)
AST: 22 IU/L (ref 0–40)
Albumin: 4.9 g/dL — ABNORMAL HIGH (ref 3.7–4.7)
Alkaline Phosphatase: 98 IU/L (ref 44–121)
Bilirubin Total: 0.6 mg/dL (ref 0.0–1.2)
Bilirubin, Direct: 0.23 mg/dL (ref 0.00–0.40)
Total Protein: 7.1 g/dL (ref 6.0–8.5)

## 2019-11-18 LAB — LIPID PANEL
Chol/HDL Ratio: 2.4 ratio (ref 0.0–5.0)
Cholesterol, Total: 119 mg/dL (ref 100–199)
HDL: 49 mg/dL (ref >39)
LDL Chol Calc (NIH): 52 mg/dL (ref 0–99)
Triglycerides: 94 mg/dL (ref 0–149)
VLDL Cholesterol Cal: 18 mg/dL (ref 5–40)

## 2019-11-18 LAB — BASIC METABOLIC PANEL
BUN/Creatinine Ratio: 13 (ref 10–24)
BUN: 15 mg/dL (ref 8–27)
CO2: 23 mmol/L (ref 20–29)
Calcium: 9.7 mg/dL (ref 8.6–10.2)
Chloride: 103 mmol/L (ref 96–106)
Creatinine, Ser: 1.13 mg/dL (ref 0.76–1.27)
GFR calc Af Amer: 75 mL/min/{1.73_m2} (ref >59)
GFR calc non Af Amer: 65 mL/min/{1.73_m2} (ref >59)
Glucose: 100 mg/dL — ABNORMAL HIGH (ref 65–99)
Potassium: 4.4 mmol/L (ref 3.5–5.2)
Sodium: 139 mmol/L (ref 134–144)

## 2019-11-18 LAB — CBC
Hematocrit: 40.5 % (ref 37.5–51.0)
Hemoglobin: 14 g/dL (ref 13.0–17.7)
MCH: 29.3 pg (ref 26.6–33.0)
MCHC: 34.6 g/dL (ref 31.5–35.7)
MCV: 85 fL (ref 79–97)
Platelets: 215 10*3/uL (ref 150–450)
RBC: 4.78 x10E6/uL (ref 4.14–5.80)
RDW: 13.6 % (ref 11.6–15.4)
WBC: 9.2 10*3/uL (ref 3.4–10.8)

## 2019-11-18 LAB — MAGNESIUM: Magnesium: 2 mg/dL (ref 1.6–2.3)

## 2019-11-18 NOTE — Patient Instructions (Addendum)
After Visit Summary:  We will be checking the following labs today - BMET, CBC, HPF, Lipids and Mg level   Medication Instructions:    Continue with your current medicines. BUT   I would try getting off the Omeprazole - you could take some Pepcid if you need to    If you need a refill on your cardiac medications before your next appointment, please call your pharmacy.     Testing/Procedures To Be Arranged:  N/A  Follow-Up:   See Dr. Rayann Heman in 6 months.     At [facility], you and your health needs are our priority.  As part of our continuing mission to provide you with exceptional heart care, we have created designated Provider Care Teams.  These Care Teams include your primary Cardiologist (physician) and Advanced Practice Providers (APPs -  Physician Assistants and Nurse Practitioners) who all work together to provide you with the care you need, when you need it.  Special Instructions:  . Stay safe, wash your hands for at least 20 seconds and wear a mask when needed.  . It was good to talk with you today.  . You have a "bifiscicular block" on your EKG - if you have any dizzy or passing out spells - let us know. You are not on any medicines that could slow your heart rate and do not ever need to be on these.    Call the [place] office at [phone] if you have any questions, problems or concerns.

## 2019-12-10 DIAGNOSIS — I10 Essential (primary) hypertension: Secondary | ICD-10-CM | POA: Diagnosis not present

## 2019-12-10 DIAGNOSIS — Z125 Encounter for screening for malignant neoplasm of prostate: Secondary | ICD-10-CM | POA: Diagnosis not present

## 2019-12-17 DIAGNOSIS — I2510 Atherosclerotic heart disease of native coronary artery without angina pectoris: Secondary | ICD-10-CM | POA: Diagnosis not present

## 2019-12-17 DIAGNOSIS — M1A9XX0 Chronic gout, unspecified, without tophus (tophi): Secondary | ICD-10-CM | POA: Diagnosis not present

## 2019-12-17 DIAGNOSIS — I6523 Occlusion and stenosis of bilateral carotid arteries: Secondary | ICD-10-CM | POA: Diagnosis not present

## 2019-12-17 DIAGNOSIS — J309 Allergic rhinitis, unspecified: Secondary | ICD-10-CM | POA: Diagnosis not present

## 2019-12-17 DIAGNOSIS — N200 Calculus of kidney: Secondary | ICD-10-CM | POA: Diagnosis not present

## 2019-12-17 DIAGNOSIS — K219 Gastro-esophageal reflux disease without esophagitis: Secondary | ICD-10-CM | POA: Diagnosis not present

## 2019-12-17 DIAGNOSIS — I10 Essential (primary) hypertension: Secondary | ICD-10-CM | POA: Diagnosis not present

## 2019-12-17 DIAGNOSIS — J4520 Mild intermittent asthma, uncomplicated: Secondary | ICD-10-CM | POA: Diagnosis not present

## 2019-12-17 DIAGNOSIS — Z0000 Encounter for general adult medical examination without abnormal findings: Secondary | ICD-10-CM | POA: Diagnosis not present

## 2019-12-17 DIAGNOSIS — E789 Disorder of lipoprotein metabolism, unspecified: Secondary | ICD-10-CM | POA: Diagnosis not present

## 2020-07-14 DIAGNOSIS — H401112 Primary open-angle glaucoma, right eye, moderate stage: Secondary | ICD-10-CM | POA: Diagnosis not present

## 2020-07-14 DIAGNOSIS — H40012 Open angle with borderline findings, low risk, left eye: Secondary | ICD-10-CM | POA: Diagnosis not present

## 2020-10-27 DIAGNOSIS — Z23 Encounter for immunization: Secondary | ICD-10-CM | POA: Diagnosis not present

## 2020-12-17 DIAGNOSIS — I10 Essential (primary) hypertension: Secondary | ICD-10-CM | POA: Diagnosis not present

## 2020-12-17 DIAGNOSIS — Z125 Encounter for screening for malignant neoplasm of prostate: Secondary | ICD-10-CM | POA: Diagnosis not present

## 2020-12-21 DIAGNOSIS — Z1212 Encounter for screening for malignant neoplasm of rectum: Secondary | ICD-10-CM | POA: Diagnosis not present

## 2020-12-21 DIAGNOSIS — D485 Neoplasm of uncertain behavior of skin: Secondary | ICD-10-CM | POA: Diagnosis not present

## 2020-12-21 DIAGNOSIS — M1A9XX0 Chronic gout, unspecified, without tophus (tophi): Secondary | ICD-10-CM | POA: Diagnosis not present

## 2020-12-21 DIAGNOSIS — E789 Disorder of lipoprotein metabolism, unspecified: Secondary | ICD-10-CM | POA: Diagnosis not present

## 2020-12-21 DIAGNOSIS — I10 Essential (primary) hypertension: Secondary | ICD-10-CM | POA: Diagnosis not present

## 2020-12-21 DIAGNOSIS — K219 Gastro-esophageal reflux disease without esophagitis: Secondary | ICD-10-CM | POA: Diagnosis not present

## 2020-12-21 DIAGNOSIS — M810 Age-related osteoporosis without current pathological fracture: Secondary | ICD-10-CM | POA: Diagnosis not present

## 2020-12-21 DIAGNOSIS — I700 Atherosclerosis of aorta: Secondary | ICD-10-CM | POA: Diagnosis not present

## 2020-12-21 DIAGNOSIS — I6523 Occlusion and stenosis of bilateral carotid arteries: Secondary | ICD-10-CM | POA: Diagnosis not present

## 2020-12-21 DIAGNOSIS — J309 Allergic rhinitis, unspecified: Secondary | ICD-10-CM | POA: Diagnosis not present

## 2020-12-21 DIAGNOSIS — J4520 Mild intermittent asthma, uncomplicated: Secondary | ICD-10-CM | POA: Diagnosis not present

## 2020-12-21 DIAGNOSIS — Z0000 Encounter for general adult medical examination without abnormal findings: Secondary | ICD-10-CM | POA: Diagnosis not present

## 2020-12-21 DIAGNOSIS — I2510 Atherosclerotic heart disease of native coronary artery without angina pectoris: Secondary | ICD-10-CM | POA: Diagnosis not present

## 2020-12-29 DIAGNOSIS — L821 Other seborrheic keratosis: Secondary | ICD-10-CM | POA: Diagnosis not present

## 2020-12-29 DIAGNOSIS — D485 Neoplasm of uncertain behavior of skin: Secondary | ICD-10-CM | POA: Diagnosis not present

## 2020-12-29 DIAGNOSIS — D0439 Carcinoma in situ of skin of other parts of face: Secondary | ICD-10-CM | POA: Diagnosis not present

## 2020-12-29 DIAGNOSIS — D1801 Hemangioma of skin and subcutaneous tissue: Secondary | ICD-10-CM | POA: Diagnosis not present

## 2021-02-03 DIAGNOSIS — M810 Age-related osteoporosis without current pathological fracture: Secondary | ICD-10-CM | POA: Diagnosis not present

## 2021-02-10 DIAGNOSIS — H40012 Open angle with borderline findings, low risk, left eye: Secondary | ICD-10-CM | POA: Diagnosis not present

## 2021-02-10 DIAGNOSIS — H401112 Primary open-angle glaucoma, right eye, moderate stage: Secondary | ICD-10-CM | POA: Diagnosis not present

## 2021-02-10 DIAGNOSIS — H353133 Nonexudative age-related macular degeneration, bilateral, advanced atrophic without subfoveal involvement: Secondary | ICD-10-CM | POA: Diagnosis not present

## 2021-02-10 DIAGNOSIS — H04123 Dry eye syndrome of bilateral lacrimal glands: Secondary | ICD-10-CM | POA: Diagnosis not present

## 2021-04-06 DIAGNOSIS — L821 Other seborrheic keratosis: Secondary | ICD-10-CM | POA: Diagnosis not present

## 2021-04-06 DIAGNOSIS — Z85828 Personal history of other malignant neoplasm of skin: Secondary | ICD-10-CM | POA: Diagnosis not present

## 2021-05-12 DIAGNOSIS — M79641 Pain in right hand: Secondary | ICD-10-CM | POA: Diagnosis not present

## 2021-05-12 DIAGNOSIS — R109 Unspecified abdominal pain: Secondary | ICD-10-CM | POA: Diagnosis not present

## 2021-05-24 DIAGNOSIS — M25531 Pain in right wrist: Secondary | ICD-10-CM | POA: Diagnosis not present

## 2021-05-25 ENCOUNTER — Encounter: Payer: Self-pay | Admitting: Family Medicine

## 2021-05-25 ENCOUNTER — Ambulatory Visit: Payer: Medicare Other

## 2021-05-25 ENCOUNTER — Ambulatory Visit (INDEPENDENT_AMBULATORY_CARE_PROVIDER_SITE_OTHER): Payer: Medicare Other | Admitting: Family Medicine

## 2021-05-25 VITALS — BP 142/82 | HR 66 | Ht 68.0 in | Wt 182.8 lb

## 2021-05-25 DIAGNOSIS — M810 Age-related osteoporosis without current pathological fracture: Secondary | ICD-10-CM | POA: Insufficient documentation

## 2021-05-25 DIAGNOSIS — K5730 Diverticulosis of large intestine without perforation or abscess without bleeding: Secondary | ICD-10-CM | POA: Insufficient documentation

## 2021-05-25 DIAGNOSIS — M25531 Pain in right wrist: Secondary | ICD-10-CM | POA: Diagnosis not present

## 2021-05-25 MED ORDER — COLCHICINE 0.6 MG PO TABS: 0.6000 mg | ORAL_TABLET | Freq: Every day | ORAL | 1 refills | Status: AC

## 2021-05-25 NOTE — Patient Instructions (Addendum)
Nice to meet you today.  I've prescribed you colchicine.  Wrist brace or wrist widget brace.  I've referred you to hand therapy.  Their office will call you to schedule but please let us know if you don't hear from them in one month regarding scheduling.  Please use Voltaren gel (Generic Diclofenac Gel) up to 4x daily for pain as needed.  This is available over-the-counter as both the name brand Voltaren gel and the generic diclofenac gel.   Follow-up: one month

## 2021-05-25 NOTE — Progress Notes (Signed)
   I, Wendy Poet, LAT, ATC, am serving as scribe for Dr. Lynne Leader.  Subjective:    CC: R wrist and forearm pain  HPI: Pt is a 71 y/o male c/o R wrist and forearm pain x 4 weeks after excessively cranking his lawnmower . Pt locates pain to his R dorsal, medial wrist.  Radiating pain: yes into his R forearm Swelling: yes at the R medial wrist Aggravating factors: eating; R wrist RD AROM Treatments tried: Advil;   Pertinent review of Systems: No fevers or chills  Relevant historical information: History of gout   Objective:    Vitals:   05/25/21 1409  BP: (!) 142/82  Pulse: 66  SpO2: 94%   General: Well Developed, well nourished, and in no acute distress.   MSK: Right wrist: Swollen dorsal ulnar wrist. Tender palpation in this region. Decreased wrist motion pain with extension. Pulses capillary fill and sensation are intact distally.  Lab and Radiology Results  Diagnostic Limited MSK Ultrasound of: Right ulnar wrist Hypoechoic fluid surrounds ECU tendon sheath. Probable linear split tear present near the ulnar styloid of the ECU tendon.  ECU tendon is not completely torn and appears to be intact distally and proximally. Degenerative changes present at ulnar side of wrist joint at TFCC. Small wrist effusion present Impression: Probable linear split tear ECU tendon and ulnar sided degenerative changes.  X-ray images right wrist obtained at PCP office and provided by patient on a CD today personally and independently interpreted DJD present at first [facility] and radial wrist.  Erosions present at ulnar wrist.  No acute fractures are present.    Impression and Recommendations:    Assessment and Plan: 71 y.o. male with right wrist pain thought to be due to partial tear of the ECU tendon and degenerative changes of the ulnar side of the wrist.  Plan for wrist brace, Voltaren gel, and hand therapy.  Recheck in 1 month.  Also recommend colchicine given concern for gout  based on bony erosions seen on x-ray.Marland Kitchen  PDMP not reviewed this encounter. Orders Placed This Encounter  Procedures   [place] LIMITED JOINT SPACE STRUCTURES UP RIGHT(NO LINKED CHARGES)    Order Specific Question:   Reason for Exam (SYMPTOM  OR DIAGNOSIS REQUIRED)    Answer:   R wrist pain    Order Specific Question:   Preferred imaging location?    Answer:   [place]   Ambulatory referral to Physical Therapy    Referral Priority:   Routine    Referral Type:   Physical Medicine    Referral Reason:   Specialty Services Required    Requested Specialty:   Physical Therapy    Number of Visits Requested:   1   Meds ordered this encounter  Medications   colchicine 0.6 MG tablet    Sig: Take 1 tablet (0.6 mg total) by mouth daily. Only take if gout flare up    Dispense:  30 tablet    Refill:  1    Discussed warning signs or symptoms. Please see discharge instructions. Patient expresses understanding.   The above documentation has been reviewed and is accurate and complete Lynne Leader, M.D.

## 2021-06-03 DIAGNOSIS — M25431 Effusion, right wrist: Secondary | ICD-10-CM | POA: Diagnosis not present

## 2021-06-03 DIAGNOSIS — M25631 Stiffness of right wrist, not elsewhere classified: Secondary | ICD-10-CM | POA: Diagnosis not present

## 2021-06-03 DIAGNOSIS — M25531 Pain in right wrist: Secondary | ICD-10-CM | POA: Diagnosis not present

## 2021-06-03 DIAGNOSIS — R531 Weakness: Secondary | ICD-10-CM | POA: Diagnosis not present

## 2021-06-07 DIAGNOSIS — M25631 Stiffness of right wrist, not elsewhere classified: Secondary | ICD-10-CM | POA: Diagnosis not present

## 2021-06-07 DIAGNOSIS — M25531 Pain in right wrist: Secondary | ICD-10-CM | POA: Diagnosis not present

## 2021-06-07 DIAGNOSIS — R531 Weakness: Secondary | ICD-10-CM | POA: Diagnosis not present

## 2021-06-07 DIAGNOSIS — M25431 Effusion, right wrist: Secondary | ICD-10-CM | POA: Diagnosis not present

## 2021-06-11 DIAGNOSIS — M25631 Stiffness of right wrist, not elsewhere classified: Secondary | ICD-10-CM | POA: Diagnosis not present

## 2021-06-11 DIAGNOSIS — R531 Weakness: Secondary | ICD-10-CM | POA: Diagnosis not present

## 2021-06-11 DIAGNOSIS — M25531 Pain in right wrist: Secondary | ICD-10-CM | POA: Diagnosis not present

## 2021-06-11 DIAGNOSIS — M25431 Effusion, right wrist: Secondary | ICD-10-CM | POA: Diagnosis not present

## 2021-06-14 DIAGNOSIS — R531 Weakness: Secondary | ICD-10-CM | POA: Diagnosis not present

## 2021-06-14 DIAGNOSIS — M25631 Stiffness of right wrist, not elsewhere classified: Secondary | ICD-10-CM | POA: Diagnosis not present

## 2021-06-14 DIAGNOSIS — M25431 Effusion, right wrist: Secondary | ICD-10-CM | POA: Diagnosis not present

## 2021-06-14 DIAGNOSIS — M25531 Pain in right wrist: Secondary | ICD-10-CM | POA: Diagnosis not present

## 2021-06-18 DIAGNOSIS — M25431 Effusion, right wrist: Secondary | ICD-10-CM | POA: Diagnosis not present

## 2021-06-18 DIAGNOSIS — M25531 Pain in right wrist: Secondary | ICD-10-CM | POA: Diagnosis not present

## 2021-06-18 DIAGNOSIS — R531 Weakness: Secondary | ICD-10-CM | POA: Diagnosis not present

## 2021-06-18 DIAGNOSIS — M25631 Stiffness of right wrist, not elsewhere classified: Secondary | ICD-10-CM | POA: Diagnosis not present

## 2021-06-21 DIAGNOSIS — M25631 Stiffness of right wrist, not elsewhere classified: Secondary | ICD-10-CM | POA: Diagnosis not present

## 2021-06-21 DIAGNOSIS — R531 Weakness: Secondary | ICD-10-CM | POA: Diagnosis not present

## 2021-06-21 DIAGNOSIS — M25431 Effusion, right wrist: Secondary | ICD-10-CM | POA: Diagnosis not present

## 2021-06-21 DIAGNOSIS — M25531 Pain in right wrist: Secondary | ICD-10-CM | POA: Diagnosis not present

## 2021-06-22 ENCOUNTER — Ambulatory Visit: Payer: Self-pay

## 2021-06-22 ENCOUNTER — Ambulatory Visit (INDEPENDENT_AMBULATORY_CARE_PROVIDER_SITE_OTHER): Payer: Medicare Other | Admitting: Family Medicine

## 2021-06-22 VITALS — BP 136/84 | HR 81 | Ht 68.0 in | Wt 182.4 lb

## 2021-06-22 DIAGNOSIS — M25531 Pain in right wrist: Secondary | ICD-10-CM | POA: Diagnosis not present

## 2021-06-22 DIAGNOSIS — S39012A Strain of muscle, fascia and tendon of lower back, initial encounter: Secondary | ICD-10-CM | POA: Diagnosis not present

## 2021-06-22 NOTE — Patient Instructions (Addendum)
Thank you for coming in today.   You received a steroid injection in your right wrist today. Seek immediate medical attention if the joint becomes red, extremely painful, or is oozing fluid.   Use a wrist brace as needed  Check back as needed

## 2021-06-22 NOTE — Progress Notes (Signed)
I, Peterson Lombard, LAT, ATC acting as a scribe for Lynne Leader, MD.  James Olson is a 71 y.o. male who presents to [facility] at [facility] today for f/u R wrist pain thought to be due to partial tear of the ECU tendon and degenerative changes of the ulnar side of the wrist. Pt was last seen by Dr. Georgina Snell on 05/25/21 and was advised to wear a wrist brace, use Voltaren gel, and was referred to hand therapy. Pt was also prescribed colchicine given concern for gout based on bony erosions seen on x-ray. Today, pt reports completing 4-5 visits at hand therapy. Pt notes only slight improvement in his R wrist pain. Pt c/o a "nerve pain" brought on my pronation/supination motions that feels like his muscle is going to give out.  Patient notes he also has some right low back pain.  This has been ongoing for about a month without injury.  Pain is worse in the morning and improves with some stretching activities that he has been trying.  Pertinent review of systems: No fevers or chills  Relevant historical information: Hypertension   Exam:  BP 136/84   Pulse 81   Ht '5\' 8"'$  (1.727 m)   Wt 182 lb 6.4 oz (82.7 kg)   SpO2 96%   BMI 27.73 kg/m  General: Well Developed, well nourished, and in no acute distress.   MSK: Right wrist swollen especially the ulnar aspect of the wrist.  Tender palpation the ECU area. Decreased wrist motion.  L-spine: Nontender midline.  Tender palpation mildly right lumbar paraspinal musculature. Normal lumbar motion pain with extension and rotation.  Lab and Radiology Results  Procedure: Real-time Ultrasound Guided Injection of the right ECU tendon sheath Device: Philips Affiniti 50G Images permanently stored and available for review in PACS Verbal informed consent obtained.  Discussed risks and benefits of procedure. Warned about infection, bleeding, hyperglycemia damage to structures among others. Patient expresses understanding and  agreement Time-out conducted.   Noted no overlying erythema, induration, or other signs of local infection.   Skin prepped in a sterile fashion.   Local anesthesia: Topical Ethyl chloride.   With sterile technique and under real time ultrasound guidance: 40 mg of Kenalog and 1 mL of lidocaine injected into ECU tendon sheath. Fluid seen entering the tendon sheath.   Completed without difficulty   Pain' \\immediately'$  resolved suggesting accurate placement of the medication.   Advised to call if fevers/chills, erythema, induration, drainage, or persistent bleeding.   Images permanently stored and available for review in the ultrasound unit.  Impression: Technically successful ultrasound guided injection.         Assessment and Plan: 71 y.o. male with right wrist pain thought to be due to DJD and ECU tenosynovitis and possible partial tear.  Plan for steroid injection today.  Plan to continue home exercise program.  Recheck in 1 month.  Low back pain thought to be due to muscles dysfunction lumbar paraspinal musculature.  Recommend core strengthening exercises.  If not improving consider physical therapy and x-ray.   PDMP not reviewed this encounter. Orders Placed This Encounter  Procedures   [place] LIMITED JOINT SPACE STRUCTURES UP RIGHT(NO LINKED CHARGES)    Order Specific Question:   Reason for Exam (SYMPTOM  OR DIAGNOSIS REQUIRED)    Answer:   right wrist pain    Order Specific Question:   Preferred imaging location?    Answer:   [place]   No orders  of the defined types were placed in this encounter.    Discussed warning signs or symptoms. Please see discharge instructions. Patient expresses understanding.   The above documentation has been reviewed and is accurate and complete Lynne Leader, M.D.

## 2021-06-25 DIAGNOSIS — M25531 Pain in right wrist: Secondary | ICD-10-CM | POA: Diagnosis not present

## 2021-06-25 DIAGNOSIS — M25631 Stiffness of right wrist, not elsewhere classified: Secondary | ICD-10-CM | POA: Diagnosis not present

## 2021-06-25 DIAGNOSIS — R531 Weakness: Secondary | ICD-10-CM | POA: Diagnosis not present

## 2021-06-25 DIAGNOSIS — M25431 Effusion, right wrist: Secondary | ICD-10-CM | POA: Diagnosis not present

## 2021-08-10 DIAGNOSIS — H401112 Primary open-angle glaucoma, right eye, moderate stage: Secondary | ICD-10-CM | POA: Diagnosis not present

## 2021-08-10 DIAGNOSIS — H40012 Open angle with borderline findings, low risk, left eye: Secondary | ICD-10-CM | POA: Diagnosis not present

## 2021-10-06 DIAGNOSIS — Z23 Encounter for immunization: Secondary | ICD-10-CM | POA: Diagnosis not present

## 2021-10-18 DIAGNOSIS — Z23 Encounter for immunization: Secondary | ICD-10-CM | POA: Diagnosis not present

## 2021-12-20 DIAGNOSIS — Z125 Encounter for screening for malignant neoplasm of prostate: Secondary | ICD-10-CM | POA: Diagnosis not present

## 2021-12-20 DIAGNOSIS — I10 Essential (primary) hypertension: Secondary | ICD-10-CM | POA: Diagnosis not present

## 2021-12-23 DIAGNOSIS — Z0000 Encounter for general adult medical examination without abnormal findings: Secondary | ICD-10-CM | POA: Diagnosis not present

## 2021-12-23 DIAGNOSIS — I700 Atherosclerosis of aorta: Secondary | ICD-10-CM | POA: Diagnosis not present

## 2021-12-23 DIAGNOSIS — J309 Allergic rhinitis, unspecified: Secondary | ICD-10-CM | POA: Diagnosis not present

## 2021-12-23 DIAGNOSIS — I2510 Atherosclerotic heart disease of native coronary artery without angina pectoris: Secondary | ICD-10-CM | POA: Diagnosis not present

## 2021-12-23 DIAGNOSIS — M810 Age-related osteoporosis without current pathological fracture: Secondary | ICD-10-CM | POA: Diagnosis not present

## 2021-12-23 DIAGNOSIS — M1A9XX0 Chronic gout, unspecified, without tophus (tophi): Secondary | ICD-10-CM | POA: Diagnosis not present

## 2021-12-23 DIAGNOSIS — H6123 Impacted cerumen, bilateral: Secondary | ICD-10-CM | POA: Diagnosis not present

## 2021-12-23 DIAGNOSIS — I6523 Occlusion and stenosis of bilateral carotid arteries: Secondary | ICD-10-CM | POA: Diagnosis not present

## 2021-12-23 DIAGNOSIS — M25571 Pain in right ankle and joints of right foot: Secondary | ICD-10-CM | POA: Diagnosis not present

## 2021-12-23 DIAGNOSIS — J4520 Mild intermittent asthma, uncomplicated: Secondary | ICD-10-CM | POA: Diagnosis not present

## 2021-12-23 DIAGNOSIS — K219 Gastro-esophageal reflux disease without esophagitis: Secondary | ICD-10-CM | POA: Diagnosis not present

## 2021-12-23 DIAGNOSIS — I10 Essential (primary) hypertension: Secondary | ICD-10-CM | POA: Diagnosis not present

## 2022-01-31 DIAGNOSIS — H6122 Impacted cerumen, left ear: Secondary | ICD-10-CM | POA: Diagnosis not present

## 2022-02-15 DIAGNOSIS — H348112 Central retinal vein occlusion, right eye, stable: Secondary | ICD-10-CM | POA: Diagnosis not present

## 2022-02-15 DIAGNOSIS — H353133 Nonexudative age-related macular degeneration, bilateral, advanced atrophic without subfoveal involvement: Secondary | ICD-10-CM | POA: Diagnosis not present

## 2022-02-15 DIAGNOSIS — H40012 Open angle with borderline findings, low risk, left eye: Secondary | ICD-10-CM | POA: Diagnosis not present

## 2022-02-15 DIAGNOSIS — H401112 Primary open-angle glaucoma, right eye, moderate stage: Secondary | ICD-10-CM | POA: Diagnosis not present

## 2022-02-15 DIAGNOSIS — H524 Presbyopia: Secondary | ICD-10-CM | POA: Diagnosis not present

## 2022-07-14 DIAGNOSIS — L84 Corns and callosities: Secondary | ICD-10-CM | POA: Diagnosis not present

## 2022-07-19 DIAGNOSIS — M21621 Bunionette of right foot: Secondary | ICD-10-CM | POA: Diagnosis not present

## 2022-07-19 DIAGNOSIS — M21622 Bunionette of left foot: Secondary | ICD-10-CM | POA: Diagnosis not present

## 2022-07-19 DIAGNOSIS — M79671 Pain in right foot: Secondary | ICD-10-CM | POA: Diagnosis not present

## 2022-07-19 DIAGNOSIS — M71571 Other bursitis, not elsewhere classified, right ankle and foot: Secondary | ICD-10-CM | POA: Diagnosis not present

## 2022-07-19 DIAGNOSIS — M79672 Pain in left foot: Secondary | ICD-10-CM | POA: Diagnosis not present

## 2022-08-23 DIAGNOSIS — H40012 Open angle with borderline findings, low risk, left eye: Secondary | ICD-10-CM | POA: Diagnosis not present

## 2022-08-23 DIAGNOSIS — H401112 Primary open-angle glaucoma, right eye, moderate stage: Secondary | ICD-10-CM | POA: Diagnosis not present

## 2022-08-23 DIAGNOSIS — H353133 Nonexudative age-related macular degeneration, bilateral, advanced atrophic without subfoveal involvement: Secondary | ICD-10-CM | POA: Diagnosis not present

## 2022-10-13 DIAGNOSIS — Z23 Encounter for immunization: Secondary | ICD-10-CM | POA: Diagnosis not present

## 2022-10-27 DIAGNOSIS — Z23 Encounter for immunization: Secondary | ICD-10-CM | POA: Diagnosis not present

## 2023-01-24 DIAGNOSIS — E789 Disorder of lipoprotein metabolism, unspecified: Secondary | ICD-10-CM | POA: Diagnosis not present

## 2023-01-24 DIAGNOSIS — Z125 Encounter for screening for malignant neoplasm of prostate: Secondary | ICD-10-CM | POA: Diagnosis not present

## 2023-01-24 DIAGNOSIS — I10 Essential (primary) hypertension: Secondary | ICD-10-CM | POA: Diagnosis not present

## 2023-01-27 DIAGNOSIS — R3129 Other microscopic hematuria: Secondary | ICD-10-CM | POA: Diagnosis not present

## 2023-01-31 DIAGNOSIS — I6523 Occlusion and stenosis of bilateral carotid arteries: Secondary | ICD-10-CM | POA: Diagnosis not present

## 2023-01-31 DIAGNOSIS — Z0000 Encounter for general adult medical examination without abnormal findings: Secondary | ICD-10-CM | POA: Diagnosis not present

## 2023-01-31 DIAGNOSIS — M810 Age-related osteoporosis without current pathological fracture: Secondary | ICD-10-CM | POA: Diagnosis not present

## 2023-01-31 DIAGNOSIS — I10 Essential (primary) hypertension: Secondary | ICD-10-CM | POA: Diagnosis not present

## 2023-01-31 DIAGNOSIS — J309 Allergic rhinitis, unspecified: Secondary | ICD-10-CM | POA: Diagnosis not present

## 2023-01-31 DIAGNOSIS — I2510 Atherosclerotic heart disease of native coronary artery without angina pectoris: Secondary | ICD-10-CM | POA: Diagnosis not present

## 2023-01-31 DIAGNOSIS — I700 Atherosclerosis of aorta: Secondary | ICD-10-CM | POA: Diagnosis not present

## 2023-01-31 DIAGNOSIS — J4520 Mild intermittent asthma, uncomplicated: Secondary | ICD-10-CM | POA: Diagnosis not present

## 2023-01-31 DIAGNOSIS — M1A9XX0 Chronic gout, unspecified, without tophus (tophi): Secondary | ICD-10-CM | POA: Diagnosis not present

## 2023-01-31 DIAGNOSIS — N200 Calculus of kidney: Secondary | ICD-10-CM | POA: Diagnosis not present

## 2023-01-31 DIAGNOSIS — K219 Gastro-esophageal reflux disease without esophagitis: Secondary | ICD-10-CM | POA: Diagnosis not present

## 2023-01-31 DIAGNOSIS — H6123 Impacted cerumen, bilateral: Secondary | ICD-10-CM | POA: Diagnosis not present

## 2023-03-13 DIAGNOSIS — H524 Presbyopia: Secondary | ICD-10-CM | POA: Diagnosis not present

## 2023-03-13 DIAGNOSIS — H353231 Exudative age-related macular degeneration, bilateral, with active choroidal neovascularization: Secondary | ICD-10-CM | POA: Diagnosis not present

## 2023-03-13 DIAGNOSIS — H40012 Open angle with borderline findings, low risk, left eye: Secondary | ICD-10-CM | POA: Diagnosis not present

## 2023-03-13 DIAGNOSIS — H401112 Primary open-angle glaucoma, right eye, moderate stage: Secondary | ICD-10-CM | POA: Diagnosis not present

## 2023-03-16 NOTE — Progress Notes (Signed)
 [facility] - Clinic Note  03/17/2023   CHIEF COMPLAINT Patient presents for Retina Evaluation  HISTORY OF PRESENT ILLNESS: James Olson is a 73 y.o. male who presents to the clinic today for:  HPI     Retina Evaluation   In both eyes.  This started 4 days ago.  Duration of 4 days.  Associated Symptoms Floaters.  Context:  distance vision, mid-range vision and near vision.  I, the attending physician,  performed the HPI with the patient and updated documentation appropriately.        Comments   Retina eval per DR Zenaida Niece AMD pt is reporting that he saw Dr Zenaida Niece on Monday and was told he had AMD he has noticed some floaters and decreased vision in his left eye denies any  flashes or light pt is using dorz bid ou       Last edited by Rennis Chris, MD on 03/17/2023  5:36 PM.     Referring physician: Diona Foley, MD [address] [place],  [place] [address]  HISTORICAL INFORMATION:  Selected notes from the MEDICAL RECORD NUMBER Referred by Dr. Zenaida Niece for concern of new exu ARMD OS LEE:  Ocular Hx- PMH-   CURRENT MEDICATIONS: Current Outpatient Medications (Ophthalmic Drugs)  Medication Sig   dorzolamide (TRUSOPT) 2 % ophthalmic solution Place 1 drop into the right eye 2 (two) times daily.   No current facility-administered medications for this visit. (Ophthalmic Drugs)   Current Outpatient Medications (Other)  Medication Sig   alendronate (FOSAMAX) 70 MG tablet Take 70 mg by mouth once a week.   famotidine (PEPCID) 40 MG tablet Take 40 mg by mouth at bedtime.   albuterol (VENTOLIN HFA) 108 (90 Base) MCG/ACT inhaler Inhale 2 puffs into the lungs every 6 (six) hours as needed for wheezing or shortness of breath (takes after mowing grass).   allopurinol (ZYLOPRIM) 300 MG tablet Take 300 mg by mouth daily.   amLODipine (NORVASC) 5 MG tablet Take 5 mg by mouth daily.   aspirin EC 81 MG tablet Take 81 mg by mouth daily.   Cholecalciferol (VITAMIN D3 PO)  Take 400 Units by mouth 2 (two) times a day.   colchicine 0.6 MG tablet Take 1 tablet (0.6 mg total) by mouth daily. Only take if gout flare up   ezetimibe (ZETIA) 10 MG tablet Take 10 mg by mouth daily.   fluocinonide cream (LIDEX) 0.05 % Apply 1 application topically 2 (two) times daily as needed (dry skin).    fluticasone (FLONASE) 50 MCG/ACT nasal spray Place 1 spray into both nostrils as needed for allergies or rhinitis (congestion).   magnesium oxide (MAG-OX) 400 MG tablet Take 400 mg by mouth 2 (two) times daily.   Magnesium Oxide 400 (240 Mg) MG TABS Take 1-2 tablets by mouth daily.   omeprazole (PRILOSEC) 40 MG capsule Take 40 mg by mouth every other day.    OVER THE COUNTER MEDICATION Take 1 tablet by mouth daily. Macular Degeneration Supplement   rosuvastatin (CRESTOR) 20 MG tablet Take 20 mg by mouth daily.   telmisartan (MICARDIS) 80 MG tablet Take 80 mg by mouth daily.   No current facility-administered medications for this visit. (Other)   REVIEW OF SYSTEMS: ROS   Positive for: Cardiovascular, Eyes, Allergic/Imm Negative for: Constitutional, Gastrointestinal, Neurological, Skin, Genitourinary, Musculoskeletal, HENT, Endocrine, Respiratory, Psychiatric, Heme/Lymph Last edited by Annalee Genta D, COT on 03/17/2023 10:37 AM.     ALLERGIES Allergies  Allergen Reactions  Ace Inhibitors Cough   Aspirin Other (See Comments)    Can take enteric coated but not the regular. GI issues   Benicar [Olmesartan Medoxomil] Cough   Other     Preservative in some eye drops. Causes eyes to blister.    PAST MEDICAL HISTORY Past Medical History:  Diagnosis Date   Abnormal stress test    H/O   Atypical chest pain    Diverticulosis    H/O   GERD (gastroesophageal reflux disease)    Hyperlipidemia    Hypertension    Obesity    RBBB    Past Surgical History:  Procedure Laterality Date   CARDIAC CATHETERIZATION  11/26/08   EF 55%  WITH DR. Deborah Chalk   COLONOSCOPY  03/01/07    W/POLYPECTOMY AND BX   GALLBLADDER SURGERY     PERCUTANEOUS PINNING PHALANX FRACTURE OF HAND     FAMILY HISTORY Family History  Problem Relation Age of Onset   Arrhythmia Mother         HAS PACEMAKER   Atrial fibrillation Mother        PAF   Sudden death Father 21   SOCIAL HISTORY Social History   Tobacco Use   Smoking status: Never   Smokeless tobacco: Never  Vaping Use   Vaping status: Never Used  Substance Use Topics   Alcohol use: No   Drug use: No       OPHTHALMIC EXAM:  Base Eye Exam     Visual Acuity (Snellen - Linear)       Right Left   Dist cc 20/25 -3 20/50 -1   Dist ph cc NI NI    Correction: Glasses         Tonometry (Tonopen, 9:29 AM)       Right Left   Pressure 17 18         Pupils       Pupils Dark Light Shape React APD   Right PERRL 3 2 Round Brisk None   Left PERRL 3 2 Round Brisk None         Visual Fields       Left Right    Full Full         Extraocular Movement       Right Left    Full, Ortho Full, Ortho         Neuro/Psych     Oriented x3: Yes   Mood/Affect: Normal         Dilation     Both eyes: 2.5% Phenylephrine @ 9:29 AM           Slit Lamp and Fundus Exam     External Exam       Right Left   External Normal Normal         Slit Lamp Exam       Right Left   Lids/Lashes dermatochalasism, mild MGD dermatochalasis, mild MGD   Conjunctiva/Sclera white and quiet white and quiet   Cornea tear film debris, well healed cataract woun tear film debris, well healed cataract woun   Anterior Chamber deep and clear deep and clear   Iris round and dilated round and dilated   Lens PCIOL perfect positoin, open PC PCIOL perfect positoin, open PC   Anterior Vitreous syneresis, PVD syneresis, PVD         Fundus Exam       Right Left   Disc mild pallor, sharp rim, PPA mild pallor, sharp rim, mild PPA  C/D Ratio 0.4 0.4   Macula flat, blunted foveal reflex, drusen, RPE mottling, clumping, and  atorphy, no heme or edema blunted foveal reflex, central CNV with edema and +heme, drusen, RPE mottling, clumping, and atrophy   Vessels attenuated and tortuous attenuated and mild tortuosity   Periphery attached, reticular degenration, peripheral drusen, pavingstone degeneration, no heme attached, no heme, reticular degeneration, scattered drusen, scattered pavingstone degeneration           Refraction     Wearing Rx       Sphere Cylinder Axis Add   Right -1.25 +1.00 015 +2.25   Left -0.25 +1.00 177 +2.25         Manifest Refraction   Unable to improve OU           IMAGING AND PROCEDURES  Imaging and Procedures for 03/17/2023  OCT, Retina - OU - Both Eyes       Right Eye Quality was good. Central Foveal Thickness: 355. Progression has no prior data. Findings include normal foveal contour, no IRF, no SRF, retinal drusen , epiretinal membrane, macular pucker, outer retinal atrophy.   Left Eye Quality was good. Central Foveal Thickness: 463. Progression has no prior data. Findings include abnormal foveal contour, subretinal hyper-reflective material, intraretinal fluid, subretinal fluid (Central CNV with +IRF/SRF overlying).   Notes *Images captured and stored on drive  Diagnosis / Impression:  OD: non-exudative ARMD with early GA; mild ERM with pucker OS: exudative ARMD - Central CNV with +IRF/SRF overlying  Clinical management:  See below  Abbreviations: NFP - Normal foveal profile. CME - cystoid macular edema. PED - pigment epithelial detachment. IRF - intraretinal fluid. SRF - subretinal fluid. EZ - ellipsoid zone. ERM - epiretinal membrane. ORA - outer retinal atrophy. ORT - outer retinal tubulation. SRHM - subretinal hyper-reflective material. IRHM - intraretinal hyper-reflective material      Intravitreal Injection, Pharmacologic Agent - OS - Left Eye       Time Out 03/17/2023. 10:46 AM. Confirmed correct patient, procedure, site, and patient consented.    Anesthesia Topical anesthesia was used. Anesthetic medications included Lidocaine 2%, Proparacaine 0.5%.   Procedure Preparation included 5% betadine to ocular surface, eyelid speculum. A supplied needle was used.   Injection: 1.25 mg Bevacizumab 1.25mg /0.49ml   Route: Intravitreal, Site: Left Eye   NDC: [id number], Lot: [id number] , Expiration date: 04/10/2023   Post-op Post injection exam found visual acuity of at least counting fingers. The patient tolerated the procedure well. There were no complications. The patient received written and verbal post procedure care education.           ASSESSMENT/PLAN:   ICD-10-CM   1. Exudative age-related macular degeneration of left eye with active choroidal neovascularization (HCC)  H35.3221 OCT, Retina - OU - Both Eyes    Intravitreal Injection, Pharmacologic Agent - OS - Left Eye    Bevacizumab (AVASTIN) SOLN 1.25 mg    2. Advanced atrophic nonexudative age-related macular degeneration of right eye without subfoveal involvement  H35.3113 OCT, Retina - OU - Both Eyes    3. Essential hypertension  I10     4. Hypertensive retinopathy of both eyes  H35.033     5. Pseudophakia of both eyes  Z96.1      Exudative age related macular degeneration OS  - The incidence pathology and anatomy of wet AMD discussed   - discussed treatment options including observation vs intravitreal anti-VEGF agents such as Avastin, Lucentis, Eylea, Vabysmo  - Risks of endophthalmitis and vascular  occlusive events and atrophic changes discussed with patient  - BCVA OS 20/50  - OCT shows central CNV w/ overlying IRF/SRF  - recommend IVA OS #1 today, 03.14.25 - pt wishes to be treated with IVA - RBA of procedure discussed, questions answered - informed consent obtained and signed 03.14.25 - see procedure note  - f/u in 4 wks -- DFE/OCT, possible injection  2. Age related macular degeneration, non-exudative, OD  - advanced stage with early GA (noncentral)    - BCVA OD 20/25  - The incidence, anatomy, and pathology of dry AMD, risk of progression, and the AREDS and AREDS 2 study including smoking risks discussed with patient.  - Recommend amsler grid monitoring  - f/u 4 weeks  3.4. Hypertensive retinopathy OU - discussed importance of tight BP control - monitor  5. Psuedophakia OU  - beautiful surgeries   - doing well   Ophthalmic Meds Ordered this visit:  Meds ordered this encounter  Medications   Bevacizumab (AVASTIN) SOLN 1.25 mg     Return in about 4 weeks (around 04/14/2023) for ex ARMD OS - DFE, OCT, Possible Injxn.  There are no Patient Instructions on file for this visit.  Explained the diagnoses, plan, and follow up with the patient and they expressed understanding.  Patient expressed understanding of the importance of proper follow up care.   This document serves as a record of services personally performed by Karie Chimera, MD, PhD. It was created on their behalf by Annalee Genta, COMT. The creation of this record is the provider's dictation and/or activities during the visit.  Electronically signed by: Annalee Genta, COMT 03/17/23 5:44 PM  Karie Chimera, M.D., Ph.D. Diseases & Surgery of the Retina and Vitreous [facility] 03/17/2023  I have reviewed the above documentation for accuracy and completeness, and I agree with the above. Karie Chimera, M.D., Ph.D. 03/17/23 5:44 PM   Abbreviations: M myopia (nearsighted); A astigmatism; H hyperopia (farsighted); P presbyopia; Mrx spectacle prescription;  CTL contact lenses; OD right eye; OS left eye; OU both eyes  XT exotropia; ET esotropia; PEK punctate epithelial keratitis; PEE punctate epithelial erosions; DES dry eye syndrome; MGD meibomian gland dysfunction; ATs artificial tears; PFAT's preservative free artificial tears; NSC nuclear sclerotic cataract; PSC posterior subcapsular cataract; ERM epi-retinal membrane; PVD posterior vitreous  detachment; RD retinal detachment; DM diabetes mellitus; DR diabetic retinopathy; NPDR non-proliferative diabetic retinopathy; PDR proliferative diabetic retinopathy; CSME clinically significant macular edema; DME diabetic macular edema; dbh dot blot hemorrhages; CWS cotton wool spot; POAG primary open angle glaucoma; C/D cup-to-disc ratio; HVF humphrey visual field; GVF goldmann visual field; OCT optical coherence tomography; IOP intraocular pressure; BRVO Branch retinal vein occlusion; CRVO central retinal vein occlusion; CRAO central retinal artery occlusion; BRAO branch retinal artery occlusion; RT retinal tear; SB scleral buckle; PPV pars plana vitrectomy; VH Vitreous hemorrhage; PRP panretinal laser photocoagulation; IVK intravitreal kenalog; VMT vitreomacular traction; MH Macular hole;  NVD neovascularization of the disc; NVE neovascularization elsewhere; AREDS age related eye disease study; ARMD age related macular degeneration; POAG primary open angle glaucoma; EBMD epithelial/anterior basement membrane dystrophy; ACIOL anterior chamber intraocular lens; IOL intraocular lens; PCIOL posterior chamber intraocular lens; Phaco/IOL phacoemulsification with intraocular lens placement; PRK photorefractive keratectomy; LASIK laser assisted in situ keratomileusis; HTN hypertension; DM diabetes mellitus; COPD chronic obstructive pulmonary disease

## 2023-03-17 ENCOUNTER — Ambulatory Visit (INDEPENDENT_AMBULATORY_CARE_PROVIDER_SITE_OTHER): Payer: Self-pay | Admitting: Ophthalmology

## 2023-03-17 ENCOUNTER — Encounter (INDEPENDENT_AMBULATORY_CARE_PROVIDER_SITE_OTHER): Payer: Self-pay | Admitting: Ophthalmology

## 2023-03-17 DIAGNOSIS — I10 Essential (primary) hypertension: Secondary | ICD-10-CM | POA: Diagnosis not present

## 2023-03-17 DIAGNOSIS — H35033 Hypertensive retinopathy, bilateral: Secondary | ICD-10-CM | POA: Diagnosis not present

## 2023-03-17 DIAGNOSIS — Z961 Presence of intraocular lens: Secondary | ICD-10-CM

## 2023-03-17 DIAGNOSIS — H3581 Retinal edema: Secondary | ICD-10-CM

## 2023-03-17 DIAGNOSIS — H353113 Nonexudative age-related macular degeneration, right eye, advanced atrophic without subfoveal involvement: Secondary | ICD-10-CM | POA: Diagnosis not present

## 2023-03-17 DIAGNOSIS — H353221 Exudative age-related macular degeneration, left eye, with active choroidal neovascularization: Secondary | ICD-10-CM | POA: Diagnosis not present

## 2023-03-17 MED ORDER — BEVACIZUMAB CHEMO INJECTION 1.25MG/0.05ML SYRINGE FOR KALEIDOSCOPE
1.2500 mg | INTRAVITREAL | Status: AC | PRN
  Administered 2023-03-17: 1.25 mg via INTRAVITREAL

## 2023-04-13 NOTE — Progress Notes (Signed)
 [facility] - Clinic Note  04/14/2023   CHIEF COMPLAINT Patient presents for Retina Follow Up  HISTORY OF PRESENT ILLNESS: James Olson is a 73 y.o. male who presents to the clinic today for:  HPI     Retina Follow Up   Patient presents with  Wet AMD.  In left eye.  This started 4 weeks ago.  Duration of 4 weeks.  Since onset it is stable.  I, the attending physician,  performed the HPI with the patient and updated documentation appropriately.        Comments   4 week retina follow up ARMD and IVA OS pt is reporting vision about the same he denies any flashes or floaters       Last edited by Rennis Chris, MD on 04/14/2023 12:06 PM.    Pt states he did not notice much difference in vision after his first injection  Referring physician: Merri Brunette, MD [address] [place],  [place] [address]  HISTORICAL INFORMATION:  Selected notes from the MEDICAL RECORD NUMBER Referred by Dr. Zenaida Niece for concern of new exu ARMD OS LEE:  Ocular Hx- PMH-   CURRENT MEDICATIONS: Current Outpatient Medications (Ophthalmic Drugs)  Medication Sig   dorzolamide (TRUSOPT) 2 % ophthalmic solution Place 1 drop into the right eye 2 (two) times daily.   No current facility-administered medications for this visit. (Ophthalmic Drugs)   Current Outpatient Medications (Other)  Medication Sig   albuterol (VENTOLIN HFA) 108 (90 Base) MCG/ACT inhaler Inhale 2 puffs into the lungs every 6 (six) hours as needed for wheezing or shortness of breath (takes after mowing grass).   alendronate (FOSAMAX) 70 MG tablet Take 70 mg by mouth once a week.   allopurinol (ZYLOPRIM) 300 MG tablet Take 300 mg by mouth daily.   amLODipine (NORVASC) 5 MG tablet Take 5 mg by mouth daily.   aspirin EC 81 MG tablet Take 81 mg by mouth daily.   Cholecalciferol (VITAMIN D3 PO) Take 400 Units by mouth 2 (two) times a day.   colchicine 0.6 MG tablet Take 1 tablet (0.6 mg total) by mouth  daily. Only take if gout flare up   ezetimibe (ZETIA) 10 MG tablet Take 10 mg by mouth daily.   famotidine (PEPCID) 40 MG tablet Take 40 mg by mouth at bedtime.   fluocinonide cream (LIDEX) 0.05 % Apply 1 application topically 2 (two) times daily as needed (dry skin).    fluticasone (FLONASE) 50 MCG/ACT nasal spray Place 1 spray into both nostrils as needed for allergies or rhinitis (congestion).   magnesium oxide (MAG-OX) 400 MG tablet Take 400 mg by mouth 2 (two) times daily.   Magnesium Oxide 400 (240 Mg) MG TABS Take 1-2 tablets by mouth daily.   omeprazole (PRILOSEC) 40 MG capsule Take 40 mg by mouth every other day.    OVER THE COUNTER MEDICATION Take 1 tablet by mouth daily. Macular Degeneration Supplement   rosuvastatin (CRESTOR) 20 MG tablet Take 20 mg by mouth daily.   telmisartan (MICARDIS) 80 MG tablet Take 80 mg by mouth daily.   No current facility-administered medications for this visit. (Other)   REVIEW OF SYSTEMS: ROS   Positive for: Cardiovascular, Eyes, Allergic/Imm Negative for: Constitutional, Gastrointestinal, Neurological, Skin, Genitourinary, Musculoskeletal, HENT, Endocrine, Respiratory, Psychiatric, Heme/Lymph Last edited by Etheleen Mayhew, COT on 04/14/2023  9:06 AM.      ALLERGIES Allergies  Allergen Reactions   Ace Inhibitors Cough   Aspirin  Other (See Comments)    Can take enteric coated but not the regular. GI issues   Benicar [Olmesartan Medoxomil] Cough   Other     Preservative in some eye drops. Causes eyes to blister.    PAST MEDICAL HISTORY Past Medical History:  Diagnosis Date   Abnormal stress test    H/O   Atypical chest pain    Diverticulosis    H/O   GERD (gastroesophageal reflux disease)    Hyperlipidemia    Hypertension    Obesity    RBBB    Past Surgical History:  Procedure Laterality Date   CARDIAC CATHETERIZATION  11/26/08   EF 55%  WITH DR. Deborah Chalk   COLONOSCOPY  03/01/07   W/POLYPECTOMY AND BX   GALLBLADDER  SURGERY     PERCUTANEOUS PINNING PHALANX FRACTURE OF HAND     FAMILY HISTORY Family History  Problem Relation Age of Onset   Arrhythmia Mother         HAS PACEMAKER   Atrial fibrillation Mother        PAF   Sudden death Father 38   SOCIAL HISTORY Social History   Tobacco Use   Smoking status: Never   Smokeless tobacco: Never  Vaping Use   Vaping status: Never Used  Substance Use Topics   Alcohol use: No   Drug use: No       OPHTHALMIC EXAM:  Base Eye Exam     Visual Acuity (Snellen - Linear)       Right Left   Dist cc 20/30 -2 20/40 -1   Dist ph cc NI NI    Correction: Glasses         Tonometry (Tonopen, 9:09 AM)       Right Left   Pressure 16 17         Pupils       Pupils Dark Light Shape React APD   Right PERRL 3 2 Round Brisk None   Left PERRL 3 2 Round Brisk None         Visual Fields       Left Right    Full Full         Extraocular Movement       Right Left    Full, Ortho Full, Ortho         Neuro/Psych     Oriented x3: Yes   Mood/Affect: Normal         Dilation     Both eyes: 2.5% Phenylephrine @ 9:10 AM           Slit Lamp and Fundus Exam     External Exam       Right Left   External Normal Normal         Slit Lamp Exam       Right Left   Lids/Lashes dermatochalasism, mild MGD dermatochalasis, mild MGD   Conjunctiva/Sclera white and quiet white and quiet   Cornea tear film debris, well healed cataract woun tear film debris, well healed cataract woun   Anterior Chamber deep and clear deep and clear   Iris round and dilated round and dilated   Lens PCIOL perfect positoin, open PC PCIOL perfect positoin, open PC   Anterior Vitreous syneresis, PVD syneresis, PVD         Fundus Exam       Right Left   Disc mild pallor, sharp rim, PPA mild pallor, sharp rim, mild PPA   C/D Ratio 0.4 0.4  Macula flat, blunted foveal reflex, drusen, RPE mottling, clumping, and atrophy, no heme or edema blunted  foveal reflex, central CNV with edema and +heme -- heme fading, drusen, RPE mottling, clumping, and atrophy   Vessels attenuated and tortuous attenuated and mild tortuosity   Periphery attached, reticular degenration, peripheral drusen, pavingstone degeneration, no heme attached, no heme, reticular degeneration, scattered drusen, scattered pavingstone degeneration           Refraction     Wearing Rx       Sphere Cylinder Axis Add   Right -1.25 +1.00 015 +2.25   Left -0.25 +1.00 177 +2.25           IMAGING AND PROCEDURES  Imaging and Procedures for 04/14/2023  OCT, Retina - OU - Both Eyes       Right Eye Quality was good. Central Foveal Thickness: 351. Progression has been stable. Findings include normal foveal contour, no IRF, no SRF, retinal drusen , epiretinal membrane, macular pucker, outer retinal atrophy (early GA; mild ERM with pucker).   Left Eye Quality was good. Central Foveal Thickness: 513. Progression has improved. Findings include abnormal foveal contour, subretinal hyper-reflective material, intraretinal fluid, subretinal fluid (Central CNV with interval improvement in IRF, but interval increase SRF overlying).   Notes *Images captured and stored on drive  Diagnosis / Impression:  OD: non-exudative ARMD with early GA; mild ERM with pucker OS: exudative ARMD - Central CNV with interval improvement in IRF, but interval increase SRF overlying  Clinical management:  See below  Abbreviations: NFP - Normal foveal profile. CME - cystoid macular edema. PED - pigment epithelial detachment. IRF - intraretinal fluid. SRF - subretinal fluid. EZ - ellipsoid zone. ERM - epiretinal membrane. ORA - outer retinal atrophy. ORT - outer retinal tubulation. SRHM - subretinal hyper-reflective material. IRHM - intraretinal hyper-reflective material      Intravitreal Injection, Pharmacologic Agent - OS - Left Eye       Time Out 04/14/2023. 10:06 AM. Confirmed correct patient,  procedure, site, and patient consented.   Anesthesia Topical anesthesia was used. Anesthetic medications included Lidocaine 2%, Proparacaine 0.5%.   Procedure Preparation included 5% betadine to ocular surface, eyelid speculum. A supplied needle was used.   Injection: 1.25 mg Bevacizumab 1.25mg /0.85ml   Route: Intravitreal, Site: Left Eye   NDC: 16109-604-54, Lot: [id number] , Expiration date: 05/13/2023   Post-op Post injection exam found visual acuity of at least counting fingers. The patient tolerated the procedure well. There were no complications. The patient received written and verbal post procedure care education.           ASSESSMENT/PLAN:   ICD-10-CM   1. Exudative age-related macular degeneration of left eye with active choroidal neovascularization (HCC)  H35.3221 OCT, Retina - OU - Both Eyes    Intravitreal Injection, Pharmacologic Agent - OS - Left Eye    Bevacizumab (AVASTIN) SOLN 1.25 mg    2. Advanced atrophic nonexudative age-related macular degeneration of right eye without subfoveal involvement  H35.3113     3. Essential hypertension  I10     4. Hypertensive retinopathy of both eyes  H35.033     5. Pseudophakia of both eyes  Z96.1      Exudative age related macular degeneration OS  - s/p IVA OS #1 (03.14.2025)  - BCVA OS improved to 20/40 from 20/50  - OCT shows central CNV with interval improvement in IRF, but interval increase SRF overlying  - recommend IVA OS #2 today, 04.11.25 - pt wishes to  be treated with IVA - RBA of procedure discussed, questions answered - informed consent obtained and signed 03.14.25 - see procedure note  - f/u in 4 wks -- DFE/OCT, possible injection  2. Age related macular degeneration, non-exudative, OD  - advanced stage with early GA (noncentral)   - BCVA OD decreased to 20/30 from 20/25  - The incidence, anatomy, and pathology of dry AMD, risk of progression, and the AREDS and AREDS 2 study including smoking risks  discussed with patient.  - Recommend amsler grid monitoring  - f/u 4 weeks  3.4. Hypertensive retinopathy OU - discussed importance of tight BP control - monitor  5. Psuedophakia OU  - beautiful surgeries   - doing well   Ophthalmic Meds Ordered this visit:  Meds ordered this encounter  Medications   Bevacizumab (AVASTIN) SOLN 1.25 mg     Return in about 4 weeks (around 05/12/2023) for f/u exu ARMD OS, DFE, OCT, Possible Injxn.  There are no Patient Instructions on file for this visit.  This document serves as a record of services personally performed by Karie Chimera, MD, PhD. It was created on their behalf by Berlin Hun COT, an ophthalmic technician. The creation of this record is the provider's dictation and/or activities during the visit.    Electronically signed by: Berlin Hun COT 04.10.25 12:07 PM  This document serves as a record of services personally performed by Karie Chimera, MD, PhD. It was created on their behalf by Glee Arvin. Manson Passey, OA an ophthalmic technician. The creation of this record is the provider's dictation and/or activities during the visit.    Electronically signed by: Glee Arvin. Manson Passey, OA 04/14/23 12:07 PM  Karie Chimera, M.D., Ph.D. Diseases & Surgery of the Retina and Vitreous [facility] 04/14/2023   I have reviewed the above documentation for accuracy and completeness, and I agree with the above. Karie Chimera, M.D., Ph.D. 04/14/23 12:08 PM   Abbreviations: M myopia (nearsighted); A astigmatism; H hyperopia (farsighted); P presbyopia; Mrx spectacle prescription;  CTL contact lenses; OD right eye; OS left eye; OU both eyes  XT exotropia; ET esotropia; PEK punctate epithelial keratitis; PEE punctate epithelial erosions; DES dry eye syndrome; MGD meibomian gland dysfunction; ATs artificial tears; PFAT's preservative free artificial tears; NSC nuclear sclerotic cataract; PSC posterior subcapsular cataract;  ERM epi-retinal membrane; PVD posterior vitreous detachment; RD retinal detachment; DM diabetes mellitus; DR diabetic retinopathy; NPDR non-proliferative diabetic retinopathy; PDR proliferative diabetic retinopathy; CSME clinically significant macular edema; DME diabetic macular edema; dbh dot blot hemorrhages; CWS cotton wool spot; POAG primary open angle glaucoma; C/D cup-to-disc ratio; HVF humphrey visual field; GVF goldmann visual field; OCT optical coherence tomography; IOP intraocular pressure; BRVO Branch retinal vein occlusion; CRVO central retinal vein occlusion; CRAO central retinal artery occlusion; BRAO branch retinal artery occlusion; RT retinal tear; SB scleral buckle; PPV pars plana vitrectomy; VH Vitreous hemorrhage; PRP panretinal laser photocoagulation; IVK intravitreal kenalog; VMT vitreomacular traction; MH Macular hole;  NVD neovascularization of the disc; NVE neovascularization elsewhere; AREDS age related eye disease study; ARMD age related macular degeneration; POAG primary open angle glaucoma; EBMD epithelial/anterior basement membrane dystrophy; ACIOL anterior chamber intraocular lens; IOL intraocular lens; PCIOL posterior chamber intraocular lens; Phaco/IOL phacoemulsification with intraocular lens placement; PRK photorefractive keratectomy; LASIK laser assisted in situ keratomileusis; HTN hypertension; DM diabetes mellitus; COPD chronic obstructive pulmonary disease

## 2023-04-14 ENCOUNTER — Encounter (INDEPENDENT_AMBULATORY_CARE_PROVIDER_SITE_OTHER): Payer: Self-pay | Admitting: Ophthalmology

## 2023-04-14 ENCOUNTER — Ambulatory Visit (INDEPENDENT_AMBULATORY_CARE_PROVIDER_SITE_OTHER): Admitting: Ophthalmology

## 2023-04-14 DIAGNOSIS — Z961 Presence of intraocular lens: Secondary | ICD-10-CM | POA: Diagnosis not present

## 2023-04-14 DIAGNOSIS — I10 Essential (primary) hypertension: Secondary | ICD-10-CM | POA: Diagnosis not present

## 2023-04-14 DIAGNOSIS — H35033 Hypertensive retinopathy, bilateral: Secondary | ICD-10-CM | POA: Diagnosis not present

## 2023-04-14 DIAGNOSIS — H353221 Exudative age-related macular degeneration, left eye, with active choroidal neovascularization: Secondary | ICD-10-CM

## 2023-04-14 DIAGNOSIS — H353113 Nonexudative age-related macular degeneration, right eye, advanced atrophic without subfoveal involvement: Secondary | ICD-10-CM

## 2023-04-14 MED ORDER — BEVACIZUMAB CHEMO INJECTION 1.25MG/0.05ML SYRINGE FOR KALEIDOSCOPE
1.2500 mg | INTRAVITREAL | Status: AC | PRN
  Administered 2023-04-14: 1.25 mg via INTRAVITREAL

## 2023-05-03 NOTE — Progress Notes (Signed)
 [facility] - Clinic Note  05/16/2023   CHIEF COMPLAINT Patient presents for Retina Follow Up  HISTORY OF PRESENT ILLNESS: James Olson is a 73 y.o. male who presents to the clinic today for:  HPI     Retina Follow Up   Patient presents with  Wet AMD.  In both eyes.  This started 4 weeks ago.  Duration of 4 weeks.  Since onset it is stable.  I, the attending physician,  performed the HPI with the patient and updated documentation appropriately.        Comments   4 week retina follow up AMD OS and IVA OS pt is reporting no vision changes noticed he denies any flashes has some floaters after injection       Last edited by Ronelle Coffee, MD on 05/16/2023  9:09 PM.     Pt states vision is about the same  Referring physician: Imelda Man, MD [address] [place],  [place] [address]  HISTORICAL INFORMATION:  Selected notes from the MEDICAL RECORD NUMBER Referred by Dr. Carloyn Chi for concern of new exu ARMD OS LEE:  Ocular Hx- PMH-   CURRENT MEDICATIONS: Current Outpatient Medications (Ophthalmic Drugs)  Medication Sig   dorzolamide  (TRUSOPT ) 2 % ophthalmic solution Place 1 drop into the right eye 2 (two) times daily.   No current facility-administered medications for this visit. (Ophthalmic Drugs)   Current Outpatient Medications (Other)  Medication Sig   albuterol (VENTOLIN HFA) 108 (90 Base) MCG/ACT inhaler Inhale 2 puffs into the lungs every 6 (six) hours as needed for wheezing or shortness of breath (takes after mowing grass).   alendronate (FOSAMAX) 70 MG tablet Take 70 mg by mouth once a week.   allopurinol  (ZYLOPRIM ) 300 MG tablet Take 300 mg by mouth daily.   amLODipine (NORVASC) 5 MG tablet Take 5 mg by mouth daily.   aspirin  EC 81 MG tablet Take 81 mg by mouth daily.   Cholecalciferol (VITAMIN D3 PO) Take 400 Units by mouth 2 (two) times a day.   colchicine  0.6 MG tablet Take 1 tablet (0.6 mg total) by mouth daily. Only take if  gout flare up   ezetimibe  (ZETIA ) 10 MG tablet Take 10 mg by mouth daily.   famotidine (PEPCID) 40 MG tablet Take 40 mg by mouth at bedtime.   fluocinonide  cream (LIDEX ) 0.05 % Apply 1 application topically 2 (two) times daily as needed (dry skin).    fluticasone (FLONASE) 50 MCG/ACT nasal spray Place 1 spray into both nostrils as needed for allergies or rhinitis (congestion).   magnesium  oxide (MAG-OX) 400 MG tablet Take 400 mg by mouth 2 (two) times daily.   Magnesium  Oxide 400 (240 Mg) MG TABS Take 1-2 tablets by mouth daily.   omeprazole (PRILOSEC) 40 MG capsule Take 40 mg by mouth every other day.    OVER THE COUNTER MEDICATION Take 1 tablet by mouth daily. Macular Degeneration Supplement   rosuvastatin (CRESTOR) 20 MG tablet Take 20 mg by mouth daily.   telmisartan (MICARDIS) 80 MG tablet Take 80 mg by mouth daily.   No current facility-administered medications for this visit. (Other)   REVIEW OF SYSTEMS: ROS   Positive for: Cardiovascular, Eyes, Allergic/Imm Negative for: Constitutional, Gastrointestinal, Neurological, Skin, Genitourinary, Musculoskeletal, HENT, Endocrine, Respiratory, Psychiatric, Heme/Lymph Last edited by Alise Appl, COT on 05/16/2023  9:14 AM.       ALLERGIES Allergies  Allergen Reactions   Ace Inhibitors Cough   Aspirin   Other (See Comments)    Can take enteric coated but not the regular. GI issues   Benicar [Olmesartan Medoxomil] Cough   Other     Preservative in some eye drops. Causes eyes to blister.    PAST MEDICAL HISTORY Past Medical History:  Diagnosis Date   Abnormal stress test    H/O   Atypical chest pain    Diverticulosis    H/O   GERD (gastroesophageal reflux disease)    Hyperlipidemia    Hypertension    Obesity    RBBB    Past Surgical History:  Procedure Laterality Date   CARDIAC CATHETERIZATION  11/26/08   EF 55%  WITH DR. Ollis Bi   COLONOSCOPY  03/01/07   W/POLYPECTOMY AND BX   GALLBLADDER SURGERY      PERCUTANEOUS PINNING PHALANX FRACTURE OF HAND     FAMILY HISTORY Family History  Problem Relation Age of Onset   Arrhythmia Mother         HAS PACEMAKER   Atrial fibrillation Mother        PAF   Sudden death Father 18   SOCIAL HISTORY Social History   Tobacco Use   Smoking status: Never   Smokeless tobacco: Never  Vaping Use   Vaping status: Never Used  Substance Use Topics   Alcohol use: No   Drug use: No       OPHTHALMIC EXAM:  Base Eye Exam     Visual Acuity (Snellen - Linear)       Right Left   Dist [place] 20/40 -1 20/50 -3   Dist ph [place] 20/25 -2    Dist ph cc  NI    Correction: Glasses         Tonometry (Tonopen, 9:25 AM)       Right Left   Pressure 12 15         Pupils       Pupils Dark Light Shape React APD   Right PERRL 3 2 Round Brisk None   Left PERRL 3 2 Round Brisk None         Visual Fields       Left Right    Full Full         Extraocular Movement       Right Left    Full, Ortho Full, Ortho         Neuro/Psych     Oriented x3: Yes   Mood/Affect: Normal         Dilation     Both eyes: 2.5% Phenylephrine @ 9:25 AM           Slit Lamp and Fundus Exam     External Exam       Right Left   External Normal Normal         Slit Lamp Exam       Right Left   Lids/Lashes dermatochalasism, mild MGD dermatochalasis, mild MGD   Conjunctiva/Sclera white and quiet white and quiet   Cornea tear film debris, well healed cataract woun tear film debris, well healed cataract woun   Anterior Chamber deep and clear deep and clear   Iris round and dilated round and dilated   Lens PCIOL perfect positoin, open PC PCIOL perfect positoin, open PC   Anterior Vitreous syneresis, PVD syneresis, PVD         Fundus Exam       Right Left   Disc mild pallor, sharp rim, PPA mild pallor, sharp rim,  mild PPA   C/D Ratio 0.4 0.4   Macula flat, blunted foveal reflex, drusen, RPE mottling, clumping, and atrophy, no heme or edema  blunted foveal reflex, central CNV with edema and +heme fading, drusen, RPE mottling, clumping, and atrophy   Vessels attenuated and tortuous attenuated and mild tortuosity   Periphery attached, reticular degenration, peripheral drusen, pavingstone degeneration, no heme attached, no heme, reticular degeneration, scattered drusen, scattered pavingstone degeneration           Refraction     Wearing Rx       Sphere Cylinder Axis Add   Right -1.25 +1.00 015 +2.25   Left -0.25 +1.00 177 +2.25           IMAGING AND PROCEDURES  Imaging and Procedures for 05/16/2023  OCT, Retina - OU - Both Eyes        Right Eye Quality was good. Central Foveal Thickness: 342. Progression has been stable. Findings include normal foveal contour, no IRF, no SRF, retinal drusen , epiretinal membrane, macular pucker, pigment epithelial detachment, outer retinal atrophy (early GA; mild ERM with pucker).   Left Eye Quality was good. Central Foveal Thickness: 543. Progression has worsened. Findings include abnormal foveal contour, subretinal hyper-reflective material, intraretinal fluid, subretinal fluid (Central CNV with persistent IRF and SRF -- slightly increased).   Notes  *Images captured and stored on drive  Diagnosis / Impression:  OD: non-exudative ARMD with early GA; mild ERM with pucker OS: exudative ARMD - Central CNV with persistent IRF and SRF -- slightly increased  Clinical management:  See below  Abbreviations: NFP - Normal foveal profile. CME - cystoid macular edema. PED - pigment epithelial detachment. IRF - intraretinal fluid. SRF - subretinal fluid. EZ - ellipsoid zone. ERM - epiretinal membrane. ORA - outer retinal atrophy. ORT - outer retinal tubulation. SRHM - subretinal hyper-reflective material. IRHM - intraretinal hyper-reflective material      Intravitreal Injection, Pharmacologic Agent - OS - Left Eye       Time Out 05/16/2023. 10:16 AM. Confirmed correct patient,  procedure, site, and patient consented.   Anesthesia Topical anesthesia was used. Anesthetic medications included Lidocaine 2%, Proparacaine 0.5%.   Procedure Preparation included 5% betadine to ocular surface, eyelid speculum. A (32g) needle was used.   Injection: 1.25 mg Bevacizumab  1.25mg /0.33ml   Route: Intravitreal, Site: Left Eye   NDC: [id number], Lot: [id number], Expiration date: 07/13/2023   Post-op Post injection exam found visual acuity of at least counting fingers. The patient tolerated the procedure well. There were no complications. The patient received written and verbal post procedure care education.            ASSESSMENT/PLAN:   ICD-10-CM   1. Exudative age-related macular degeneration of left eye with active choroidal neovascularization (HCC)  H35.3221 OCT, Retina - OU - Both Eyes    Intravitreal Injection, Pharmacologic Agent - OS - Left Eye    Bevacizumab  (AVASTIN ) SOLN 1.25 mg    2. Advanced atrophic nonexudative age-related macular degeneration of right eye without subfoveal involvement  H35.3113     3. Essential hypertension  I10     4. Hypertensive retinopathy of both eyes  H35.033     5. Pseudophakia of both eyes  Z96.1      Exudative age related macular degeneration OS  - s/p IVA OS #1 (03.14.2025), #2 (04.11.25)  - BCVA OS decreased to 20/50 from 20/40  - OCT shows central CNV with persistent IRF and SRF -- slightly increased  -  recommend IVA OS #3 today, 05.13.25 - pt wishes to be treated with IVA - RBA of procedure discussed, questions answered - informed consent obtained and signed 03.14.25 - see procedure note **discussed decreased efficacy / resistance to Avastin  and potential benefit of switching medication**  - okay for all treatments / meds (Medicare and BCFed)  - f/u in 4 wks -- DFE/OCT, possible injection  2. Age related macular degeneration, non-exudative, OD  - advanced stage with early GA (noncentral)   - BCVA OD decreased to  20/30 from 20/25  - The incidence, anatomy, and pathology of dry AMD, risk of progression, and the AREDS and AREDS 2 study including smoking risks discussed with patient.  - Recommend amsler grid monitoring  - f/u 4 weeks  3.4. Hypertensive retinopathy OU - discussed importance of tight BP control - monitor  5. Psuedophakia OU  - beautiful surgeries   - doing well   Ophthalmic Meds Ordered this visit:  Meds ordered this encounter  Medications   Bevacizumab  (AVASTIN ) SOLN 1.25 mg     Return for f/u 4-5 weeks, exu ARMD OS, DFE, OCT, Possible Injxn.  There are no Patient Instructions on file for this visit.  This document serves as a record of services personally performed by Jeanice Millard, MD, PhD. It was created on their behalf by Olene Berne, COT an ophthalmic technician. The creation of this record is the provider's dictation and/or activities during the visit.    Electronically signed by:  Olene Berne, COT  05/16/23 9:11 PM  This document serves as a record of services personally performed by Jeanice Millard, MD, PhD. It was created on their behalf by Morley Arabia. Bevin Bucks, OA an ophthalmic technician. The creation of this record is the provider's dictation and/or activities during the visit.    Electronically signed by: Morley Arabia. Bevin Bucks, OA 05/16/23 9:11 PM  Jeanice Millard, M.D., Ph.D. Diseases & Surgery of the Retina and Vitreous [facility] 05/16/2023   I have reviewed the above documentation for accuracy and completeness, and I agree with the above. Jeanice Millard, M.D., Ph.D. 05/16/23 9:12 PM   Abbreviations: M myopia (nearsighted); A astigmatism; H hyperopia (farsighted); P presbyopia; Mrx spectacle prescription;  CTL contact lenses; OD right eye; OS left eye; OU both eyes  XT exotropia; ET esotropia; PEK punctate epithelial keratitis; PEE punctate epithelial erosions; DES dry eye syndrome; MGD meibomian gland dysfunction; ATs  artificial tears; PFAT's preservative free artificial tears; NSC nuclear sclerotic cataract; PSC posterior subcapsular cataract; ERM epi-retinal membrane; PVD posterior vitreous detachment; RD retinal detachment; DM diabetes mellitus; DR diabetic retinopathy; NPDR non-proliferative diabetic retinopathy; PDR proliferative diabetic retinopathy; CSME clinically significant macular edema; DME diabetic macular edema; dbh dot blot hemorrhages; CWS cotton wool spot; POAG primary open angle glaucoma; C/D cup-to-disc ratio; HVF humphrey visual field; GVF goldmann visual field; OCT optical coherence tomography; IOP intraocular pressure; BRVO Branch retinal vein occlusion; CRVO central retinal vein occlusion; CRAO central retinal artery occlusion; BRAO branch retinal artery occlusion; RT retinal tear; SB scleral buckle; PPV pars plana vitrectomy; VH Vitreous hemorrhage; PRP panretinal laser photocoagulation; IVK intravitreal kenalog; VMT vitreomacular traction; MH Macular hole;  NVD neovascularization of the disc; NVE neovascularization elsewhere; AREDS age related eye disease study; ARMD age related macular degeneration; POAG primary open angle glaucoma; EBMD epithelial/anterior basement membrane dystrophy; ACIOL anterior chamber intraocular lens; IOL intraocular lens; PCIOL posterior chamber intraocular lens; Phaco/IOL phacoemulsification with intraocular lens placement; PRK photorefractive keratectomy; LASIK  laser assisted in situ keratomileusis; HTN hypertension; DM diabetes mellitus; COPD chronic obstructive pulmonary disease

## 2023-05-16 ENCOUNTER — Encounter (INDEPENDENT_AMBULATORY_CARE_PROVIDER_SITE_OTHER): Payer: Self-pay | Admitting: Ophthalmology

## 2023-05-16 ENCOUNTER — Ambulatory Visit (INDEPENDENT_AMBULATORY_CARE_PROVIDER_SITE_OTHER): Admitting: Ophthalmology

## 2023-05-16 DIAGNOSIS — I10 Essential (primary) hypertension: Secondary | ICD-10-CM

## 2023-05-16 DIAGNOSIS — Z961 Presence of intraocular lens: Secondary | ICD-10-CM | POA: Diagnosis not present

## 2023-05-16 DIAGNOSIS — H353221 Exudative age-related macular degeneration, left eye, with active choroidal neovascularization: Secondary | ICD-10-CM

## 2023-05-16 DIAGNOSIS — H353113 Nonexudative age-related macular degeneration, right eye, advanced atrophic without subfoveal involvement: Secondary | ICD-10-CM

## 2023-05-16 DIAGNOSIS — H35033 Hypertensive retinopathy, bilateral: Secondary | ICD-10-CM

## 2023-05-16 MED ORDER — BEVACIZUMAB CHEMO INJECTION 1.25MG/0.05ML SYRINGE FOR KALEIDOSCOPE
1.2500 mg | INTRAVITREAL | Status: AC | PRN
  Administered 2023-05-16: 1.25 mg via INTRAVITREAL

## 2023-05-19 DIAGNOSIS — J069 Acute upper respiratory infection, unspecified: Secondary | ICD-10-CM | POA: Diagnosis not present

## 2023-06-13 NOTE — Progress Notes (Signed)
 [facility] - Clinic Note  06/20/2023   CHIEF COMPLAINT Patient presents for Retina Follow Up  HISTORY OF PRESENT ILLNESS: James Olson is a 73 y.o. male who presents to the clinic today for:  HPI     Retina Follow Up   Patient presents with  Wet AMD.  In left eye.  This started 5 weeks ago.  I, the attending physician,  performed the HPI with the patient and updated documentation appropriately.        Comments   Patient here for 5 weeks retina follow up for exu ARMD OS. Patient states vision gradually getting a little bit better. In bright light can see through the [facility] a little bit. No eye pain. Has new glasses on order.      Last edited by Ronelle Coffee, MD on 06/20/2023  1:04 PM.     Pt states he does notice a brightening in [place] OS.  Referring physician: Imelda Man, MD [address] [place],  [place] [address]  HISTORICAL INFORMATION:  Selected notes from the MEDICAL RECORD NUMBER Referred by Dr. Carloyn Chi for concern of new exu ARMD OS LEE:  Ocular Hx- PMH-   CURRENT MEDICATIONS: Current Outpatient Medications (Ophthalmic Drugs)  Medication Sig   dorzolamide  (TRUSOPT ) 2 % ophthalmic solution Place 1 drop into the right eye 2 (two) times daily.   No current facility-administered medications for this visit. (Ophthalmic Drugs)   Current Outpatient Medications (Other)  Medication Sig   albuterol (VENTOLIN HFA) 108 (90 Base) MCG/ACT inhaler Inhale 2 puffs into the lungs every 6 (six) hours as needed for wheezing or shortness of breath (takes after mowing grass).   alendronate (FOSAMAX) 70 MG tablet Take 70 mg by mouth once a week.   allopurinol  (ZYLOPRIM ) 300 MG tablet Take 300 mg by mouth daily.   amLODipine (NORVASC) 5 MG tablet Take 5 mg by mouth daily.   aspirin  EC 81 MG tablet Take 81 mg by mouth daily.   Cholecalciferol (VITAMIN D3 PO) Take 400 Units by mouth 2 (two) times a day.   colchicine  0.6 MG tablet Take 1  tablet (0.6 mg total) by mouth daily. Only take if gout flare up   ezetimibe  (ZETIA ) 10 MG tablet Take 10 mg by mouth daily.   famotidine (PEPCID) 40 MG tablet Take 40 mg by mouth at bedtime.   fluocinonide  cream (LIDEX ) 0.05 % Apply 1 application topically 2 (two) times daily as needed (dry skin).    fluticasone (FLONASE) 50 MCG/ACT nasal spray Place 1 spray into both nostrils as needed for allergies or rhinitis (congestion).   magnesium  oxide (MAG-OX) 400 MG tablet Take 400 mg by mouth 2 (two) times daily.   Magnesium  Oxide 400 (240 Mg) MG TABS Take 1-2 tablets by mouth daily.   omeprazole (PRILOSEC) 40 MG capsule Take 40 mg by mouth every other day.    OVER THE COUNTER MEDICATION Take 1 tablet by mouth daily. Macular Degeneration Supplement   rosuvastatin (CRESTOR) 20 MG tablet Take 20 mg by mouth daily.   telmisartan (MICARDIS) 80 MG tablet Take 80 mg by mouth daily.   No current facility-administered medications for this visit. (Other)   REVIEW OF SYSTEMS: ROS   Positive for: Cardiovascular, Eyes, Allergic/Imm Negative for: Constitutional, Gastrointestinal, Neurological, Skin, Genitourinary, Musculoskeletal, HENT, Endocrine, Respiratory, Psychiatric, Heme/Lymph Last edited by Sylvan Evener, COA on 06/20/2023  9:16 AM.        ALLERGIES Allergies  Allergen Reactions  Ace Inhibitors Cough   Aspirin  Other (See Comments)    Can take enteric coated but not the regular. GI issues   Benicar [Olmesartan Medoxomil] Cough   Other     Preservative in some eye drops. Causes eyes to blister.    PAST MEDICAL HISTORY Past Medical History:  Diagnosis Date   Abnormal stress test    H/O   Atypical chest pain    Diverticulosis    H/O   GERD (gastroesophageal reflux disease)    Hyperlipidemia    Hypertension    Obesity    RBBB    Past Surgical History:  Procedure Laterality Date   CARDIAC CATHETERIZATION  11/26/08   EF 55%  WITH DR. Ollis Bi   COLONOSCOPY  03/01/07    W/POLYPECTOMY AND BX   GALLBLADDER SURGERY     PERCUTANEOUS PINNING PHALANX FRACTURE OF HAND     FAMILY HISTORY Family History  Problem Relation Age of Onset   Arrhythmia Mother         HAS PACEMAKER   Atrial fibrillation Mother        PAF   Sudden death Father 26   SOCIAL HISTORY Social History   Tobacco Use   Smoking status: Never   Smokeless tobacco: Never  Vaping Use   Vaping status: Never Used  Substance Use Topics   Alcohol use: No   Drug use: No       OPHTHALMIC EXAM:  Base Eye Exam     Visual Acuity (Snellen - Linear)       Right Left   Dist cc 20/25 +1 20/50    Correction: Glasses         Tonometry (Tonopen, 9:13 AM)       Right Left   Pressure 14 15         Pupils       Dark Light Shape React APD   Right 3 2 Round Brisk None   Left 3 2 Round Brisk None         Visual Fields (Counting fingers)       Left Right    Full Full         Extraocular Movement       Right Left    Full, Ortho Full, Ortho         Neuro/Psych     Oriented x3: Yes   Mood/Affect: Normal         Dilation     Both eyes: 1.0% Mydriacyl, 2.5% Phenylephrine @ 9:13 AM           Slit Lamp and Fundus Exam     External Exam       Right Left   External Normal Normal         Slit Lamp Exam       Right Left   Lids/Lashes dermatochalasism, mild MGD dermatochalasis, mild MGD   Conjunctiva/Sclera white and quiet white and quiet   Cornea tear film debris, well healed cataract woun tear film debris, well healed cataract woun   Anterior Chamber deep and clear deep and clear   Iris round and dilated round and dilated   Lens PCIOL perfect positoin, open PC PCIOL perfect positoin, open PC   Anterior Vitreous syneresis, PVD syneresis, PVD         Fundus Exam       Right Left   Disc mild pallor, sharp rim, PPA mild pallor, sharp rim, mild PPA   C/D Ratio 0.4 0.4  Macula flat, blunted foveal reflex, drusen, RPE mottling, clumping, and  atrophy, no heme or edema blunted foveal reflex, central CNV with edema and +heme --improving, drusen, RPE mottling, clumping, and atrophy   Vessels attenuated and tortuous attenuated and mild tortuosity   Periphery attached, reticular degenration, peripheral drusen, pavingstone degeneration, no heme attached, no heme, reticular degeneration, scattered drusen, scattered pavingstone degeneration           Refraction     Wearing Rx       Sphere Cylinder Axis Add   Right -1.25 +1.00 015 +2.25   Left -0.25 +1.00 177 +2.25           IMAGING AND PROCEDURES  Imaging and Procedures for 06/20/2023  OCT, Retina - OU - Both Eyes       Right Eye Quality was good. Central Foveal Thickness: 334. Progression has been stable. Findings include normal foveal contour, no IRF, no SRF, retinal drusen , epiretinal membrane, macular pucker, pigment epithelial detachment, outer retinal atrophy (early GA; mild ERM with pucker).   Left Eye Quality was good. Central Foveal Thickness: 448. Progression has improved. Findings include abnormal foveal contour, subretinal hyper-reflective material, intraretinal fluid, subretinal fluid (Central CNV with interval improvement IRF and SRF).   Notes *Images captured and stored on drive  Diagnosis / Impression:  OD: non-exudative ARMD with early GA; mild ERM with pucker OS: exudative ARMD - Central CNV with interval improvement IRF and SRF  Clinical management:  See below  Abbreviations: NFP - Normal foveal profile. CME - cystoid macular edema. PED - pigment epithelial detachment. IRF - intraretinal fluid. SRF - subretinal fluid. EZ - ellipsoid zone. ERM - epiretinal membrane. ORA - outer retinal atrophy. ORT - outer retinal tubulation. SRHM - subretinal hyper-reflective material. IRHM - intraretinal hyper-reflective material      Intravitreal Injection, Pharmacologic Agent - OS - Left Eye       Time Out 06/20/2023. 10:04 AM. Confirmed correct patient,  procedure, site, and patient consented.   Anesthesia Topical anesthesia was used. Anesthetic medications included Lidocaine 2%, Proparacaine 0.5%.   Procedure Preparation included 5% betadine to ocular surface, eyelid speculum. A supplied (32g) needle was used.   Injection: 1.25 mg Bevacizumab  1.25mg /0.65ml   Route: Intravitreal, Site: Left Eye   NDC: [id number], Lot: [id number], Expiration date: 07/14/2023   Post-op Post injection exam found visual acuity of at least counting fingers. The patient tolerated the procedure well. There were no complications. The patient received written and verbal post procedure care education.             ASSESSMENT/PLAN:   ICD-10-CM   1. Exudative age-related macular degeneration of left eye with active choroidal neovascularization (HCC)  H35.3221 OCT, Retina - OU - Both Eyes    Intravitreal Injection, Pharmacologic Agent - OS - Left Eye    Bevacizumab  (AVASTIN ) SOLN 1.25 mg    2. Advanced atrophic nonexudative age-related macular degeneration of right eye without subfoveal involvement  H35.3113     3. Essential hypertension  I10     4. Hypertensive retinopathy of both eyes  H35.033     5. Pseudophakia of both eyes  Z96.1       Exudative age related macular degeneration OS  - s/p IVA OS #1 (03.14.2025), #2 (04.11.25), #3 (05.13.25)  - BCVA OS decreased to 20/50 from 20/40  - OCT shows Central CNV with interval improvement IRF and SRF  - recommend IVA OS #4 today, 06.17.25 - pt wishes to be treated with  IVA - RBA of procedure discussed, questions answered - informed consent obtained and signed 03.14.25 - see procedure note **discussed decreased efficacy / resistance to Avastin  and potential benefit of switching medication**  - okay for all treatments / meds (Medicare and BCFed)  - f/u in 4 wks -- DFE/OCT, possible injection  2. Age related macular degeneration, non-exudative, OD  - advanced stage with early GA (noncentral)   - BCVA OD  20/25 today.  - The incidence, anatomy, and pathology of dry AMD, risk of progression, and the AREDS and AREDS 2 study including smoking risks discussed with patient.  - Recommend amsler grid monitoring  - f/u 4 weeks  3.4. Hypertensive retinopathy OU - discussed importance of tight BP control - monitor  5. Psuedophakia OU  - beautiful surgeries   - doing well   Ophthalmic Meds Ordered this visit:  Meds ordered this encounter  Medications   Bevacizumab  (AVASTIN ) SOLN 1.25 mg     Return in about 4 weeks (around 07/18/2023) for exu ARMD OS, DFE, OCT, Possible Injxn.  There are no Patient Instructions on file for this visit.  This document serves as a record of services personally performed by Jeanice Millard, MD, PhD. It was created on their behalf by Angelia Kelp, an ophthalmic technician. The creation of this record is the provider's dictation and/or activities during the visit.    Electronically signed by: Angelia Kelp, OA, 06/20/23  1:05 PM  Jeanice Millard, M.D., Ph.D. Diseases & Surgery of the Retina and Vitreous [facility]   I have reviewed the above documentation for accuracy and completeness, and I agree with the above. Jeanice Millard, M.D., Ph.D. 06/20/23 1:05 PM   Abbreviations: M myopia (nearsighted); A astigmatism; H hyperopia (farsighted); P presbyopia; Mrx spectacle prescription;  CTL contact lenses; OD right eye; OS left eye; OU both eyes  XT exotropia; ET esotropia; PEK punctate epithelial keratitis; PEE punctate epithelial erosions; DES dry eye syndrome; MGD meibomian gland dysfunction; ATs artificial tears; PFAT's preservative free artificial tears; NSC nuclear sclerotic cataract; PSC posterior subcapsular cataract; ERM epi-retinal membrane; PVD posterior vitreous detachment; RD retinal detachment; DM diabetes mellitus; DR diabetic retinopathy; NPDR non-proliferative diabetic retinopathy; PDR proliferative diabetic retinopathy; CSME  clinically significant macular edema; DME diabetic macular edema; dbh dot blot hemorrhages; CWS cotton wool spot; POAG primary open angle glaucoma; C/D cup-to-disc ratio; HVF humphrey visual field; GVF goldmann visual field; OCT optical coherence tomography; IOP intraocular pressure; BRVO Branch retinal vein occlusion; CRVO central retinal vein occlusion; CRAO central retinal artery occlusion; BRAO branch retinal artery occlusion; RT retinal tear; SB scleral buckle; PPV pars plana vitrectomy; VH Vitreous hemorrhage; PRP panretinal laser photocoagulation; IVK intravitreal kenalog; VMT vitreomacular traction; MH Macular hole;  NVD neovascularization of the disc; NVE neovascularization elsewhere; AREDS age related eye disease study; ARMD age related macular degeneration; POAG primary open angle glaucoma; EBMD epithelial/anterior basement membrane dystrophy; ACIOL anterior chamber intraocular lens; IOL intraocular lens; PCIOL posterior chamber intraocular lens; Phaco/IOL phacoemulsification with intraocular lens placement; PRK photorefractive keratectomy; LASIK laser assisted in situ keratomileusis; HTN hypertension; DM diabetes mellitus; COPD chronic obstructive pulmonary disease

## 2023-06-20 ENCOUNTER — Encounter (INDEPENDENT_AMBULATORY_CARE_PROVIDER_SITE_OTHER): Payer: Self-pay | Admitting: Ophthalmology

## 2023-06-20 ENCOUNTER — Ambulatory Visit (INDEPENDENT_AMBULATORY_CARE_PROVIDER_SITE_OTHER): Admitting: Ophthalmology

## 2023-06-20 DIAGNOSIS — H353221 Exudative age-related macular degeneration, left eye, with active choroidal neovascularization: Secondary | ICD-10-CM | POA: Diagnosis not present

## 2023-06-20 DIAGNOSIS — H353113 Nonexudative age-related macular degeneration, right eye, advanced atrophic without subfoveal involvement: Secondary | ICD-10-CM | POA: Diagnosis not present

## 2023-06-20 DIAGNOSIS — Z961 Presence of intraocular lens: Secondary | ICD-10-CM

## 2023-06-20 DIAGNOSIS — I10 Essential (primary) hypertension: Secondary | ICD-10-CM

## 2023-06-20 DIAGNOSIS — H35033 Hypertensive retinopathy, bilateral: Secondary | ICD-10-CM

## 2023-06-20 MED ORDER — BEVACIZUMAB CHEMO INJECTION 1.25MG/0.05ML SYRINGE FOR KALEIDOSCOPE
1.2500 mg | INTRAVITREAL | Status: AC | PRN
  Administered 2023-06-20: 1.25 mg via INTRAVITREAL

## 2023-07-13 NOTE — Progress Notes (Signed)
 [facility] - Clinic Note  07/18/2023   CHIEF COMPLAINT Patient presents for Retina Follow Up  HISTORY OF PRESENT ILLNESS: James Olson is a 73 y.o. male who presents to the clinic today for:  HPI     Retina Follow Up   Patient presents with  Wet AMD.  In left eye.  This started 4 weeks ago.  Duration of weeks.  Since onset it is stable.  I, the attending physician,  performed the HPI with the patient and updated documentation appropriately.        Comments   4 week retina follow up IVA OS pt is reporting no vision changes noticed he has some floaters denies any flashes he has noticed more photo phobia as well depends on light       Last edited by [name], MD on 07/18/2023  9:30 PM.    Pt states he feels VA is a little bit better. Some central clearing and can see a bit better.   Referring physician: [name], MD [address] [place],  [place] [address]  HISTORICAL INFORMATION:  Selected notes from the MEDICAL RECORD NUMBER Referred by Dr. Fleeta for concern of new exu ARMD OS LEE:  Ocular Hx- PMH-   CURRENT MEDICATIONS: Current Outpatient Medications (Ophthalmic Drugs)  Medication Sig   dorzolamide  (TRUSOPT ) 2 % ophthalmic solution Place 1 drop into the right eye 2 (two) times daily.   No current facility-administered medications for this visit. (Ophthalmic Drugs)   Current Outpatient Medications (Other)  Medication Sig   albuterol (VENTOLIN HFA) 108 (90 Base) MCG/ACT inhaler Inhale 2 puffs into the lungs every 6 (six) hours as needed for wheezing or shortness of breath (takes after mowing grass).   alendronate (FOSAMAX) 70 MG tablet Take 70 mg by mouth once a week.   allopurinol  (ZYLOPRIM ) 300 MG tablet Take 300 mg by mouth daily.   amLODipine (NORVASC) 5 MG tablet Take 5 mg by mouth daily.   aspirin  EC 81 MG tablet Take 81 mg by mouth daily.   Cholecalciferol (VITAMIN D3 PO) Take 400 Units by mouth 2 (two) times a day.    colchicine  0.6 MG tablet Take 1 tablet (0.6 mg total) by mouth daily. Only take if gout flare up   ezetimibe  (ZETIA ) 10 MG tablet Take 10 mg by mouth daily.   famotidine (PEPCID) 40 MG tablet Take 40 mg by mouth at bedtime.   fluocinonide  cream (LIDEX ) 0.05 % Apply 1 application topically 2 (two) times daily as needed (dry skin).    fluticasone (FLONASE) 50 MCG/ACT nasal spray Place 1 spray into both nostrils as needed for allergies or rhinitis (congestion).   magnesium  oxide (MAG-OX) 400 MG tablet Take 400 mg by mouth 2 (two) times daily.   Magnesium  Oxide 400 (240 Mg) MG TABS Take 1-2 tablets by mouth daily.   omeprazole (PRILOSEC) 40 MG capsule Take 40 mg by mouth every other day.    OVER THE COUNTER MEDICATION Take 1 tablet by mouth daily. Macular Degeneration Supplement   rosuvastatin (CRESTOR) 20 MG tablet Take 20 mg by mouth daily.   telmisartan (MICARDIS) 80 MG tablet Take 80 mg by mouth daily.   No current facility-administered medications for this visit. (Other)   REVIEW OF SYSTEMS: ROS   Positive for: Cardiovascular, Eyes, Allergic/Imm Negative for: Constitutional, Gastrointestinal, Neurological, Skin, Genitourinary, Musculoskeletal, HENT, Endocrine, Respiratory, Psychiatric, Heme/Lymph Last edited by Resa Delon ORN, COT on 07/18/2023  9:12 AM.  ALLERGIES Allergies  Allergen Reactions   Ace Inhibitors Cough   Aspirin  Other (See Comments)    Can take enteric coated but not the regular. GI issues   Benicar [Olmesartan Medoxomil] Cough   Other     Preservative in some eye drops. Causes eyes to blister.    PAST MEDICAL HISTORY Past Medical History:  Diagnosis Date   Abnormal stress test    H/O   Atypical chest pain    Diverticulosis    H/O   GERD (gastroesophageal reflux disease)    Hyperlipidemia    Hypertension    Obesity    RBBB    Past Surgical History:  Procedure Laterality Date   CARDIAC CATHETERIZATION  11/26/08   EF 55%  WITH DR.  TISA   COLONOSCOPY  03/01/07   W/POLYPECTOMY AND BX   GALLBLADDER SURGERY     PERCUTANEOUS PINNING PHALANX FRACTURE OF HAND     FAMILY HISTORY Family History  Problem Relation Age of Onset   Arrhythmia Mother         HAS PACEMAKER   Atrial fibrillation Mother        PAF   Sudden death Father 106   SOCIAL HISTORY Social History   Tobacco Use   Smoking status: Never   Smokeless tobacco: Never  Vaping Use   Vaping status: Never Used  Substance Use Topics   Alcohol use: No   Drug use: No       OPHTHALMIC EXAM:  Base Eye Exam     Visual Acuity (Snellen - Linear)       Right Left   Dist cc 20/20 20/50   Dist ph cc  NI         Tonometry (Tonopen, 9:17 AM)       Right Left   Pressure 13 12         Pupils       Pupils Dark Light Shape React APD   Right PERRL 3 2 Round Brisk None   Left PERRL 3 2 Round Brisk None         Visual Fields       Left Right    Full Full         Extraocular Movement       Right Left    Full, Ortho Full, Ortho         Neuro/Psych     Oriented x3: Yes   Mood/Affect: Normal         Dilation     Both eyes: 2.5% Phenylephrine @ 9:17 AM           Slit Lamp and Fundus Exam     External Exam       Right Left   External Normal Normal         Slit Lamp Exam       Right Left   Lids/Lashes dermatochalasism, mild MGD dermatochalasis, mild MGD   Conjunctiva/Sclera white and quiet white and quiet   Cornea tear film debris, well healed cataract woun tear film debris, well healed cataract woun   Anterior Chamber deep and clear deep and clear   Iris round and dilated round and dilated   Lens PCIOL perfect positoin, open PC PCIOL perfect positoin, open PC   Anterior Vitreous syneresis, PVD syneresis, PVD         Fundus Exam       Right Left   Disc mild pallor, sharp rim, PPA mild pallor, sharp rim, mild PPA  C/D Ratio 0.4 0.4   Macula flat, blunted foveal reflex, drusen, RPE mottling, clumping,  and atrophy, no heme or edema blunted foveal reflex, central CNV with edema and +heme --improving, drusen, RPE mottling, clumping, and atrophy, early subretinal fibrosis centrally.   Vessels attenuated and tortuous attenuated and mild tortuosity   Periphery attached, reticular degenration, peripheral drusen, pavingstone degeneration, no heme attached, no heme, reticular degeneration, scattered drusen, scattered pavingstone degeneration           Refraction     Wearing Rx       Sphere Cylinder Axis Add   Right -1.25 +1.00 015 +2.25   Left -0.25 +1.00 177 +2.25           IMAGING AND PROCEDURES  Imaging and Procedures for 07/18/2023  OCT, Retina - OU - Both Eyes       Right Eye Quality was good. Central Foveal Thickness: 333. Progression has been stable. Findings include normal foveal contour, no IRF, no SRF, retinal drusen , epiretinal membrane, macular pucker, pigment epithelial detachment, outer retinal atrophy (early GA; mild ERM with pucker).   Left Eye Quality was good. Central Foveal Thickness: 385. Progression has improved. Findings include abnormal foveal contour, subretinal hyper-reflective material, intraretinal fluid, subretinal fluid (Central CNV with interval improvement in IRF, SRF and central SRHM. ).   Notes *Images captured and stored on drive  Diagnosis / Impression:  OD: non-exudative ARMD with early GA; mild ERM with pucker OS: exudative ARMD - Central CNV with interval improvement in IRF, SRF and central SRHM.   Clinical management:  See below  Abbreviations: NFP - Normal foveal profile. CME - cystoid macular edema. PED - pigment epithelial detachment. IRF - intraretinal fluid. SRF - subretinal fluid. EZ - ellipsoid zone. ERM - epiretinal membrane. ORA - outer retinal atrophy. ORT - outer retinal tubulation. SRHM - subretinal hyper-reflective material. IRHM - intraretinal hyper-reflective material      Intravitreal Injection, Pharmacologic Agent - OS  - Left Eye       Time Out 07/18/2023. 10:43 AM. Confirmed correct patient, procedure, site, and patient consented.   Anesthesia Topical anesthesia was used. Anesthetic medications included Lidocaine 2%, Proparacaine 0.5%.   Procedure Preparation included 5% betadine to ocular surface, eyelid speculum. A supplied (32g) needle was used.   Injection: 1.25 mg Bevacizumab  1.25mg /0.50ml   Route: Intravitreal, Site: Left Eye   NDC: 49757-939-98, Lot: [id number], Expiration date: 07/31/2023   Post-op Post injection exam found visual acuity of at least counting fingers. The patient tolerated the procedure well. There were no complications. The patient received written and verbal post procedure care education.           ASSESSMENT/PLAN:   ICD-10-CM   1. Exudative age-related macular degeneration of left eye with active choroidal neovascularization (HCC)  H35.3221 OCT, Retina - OU - Both Eyes    Intravitreal Injection, Pharmacologic Agent - OS - Left Eye    Bevacizumab  (AVASTIN ) SOLN 1.25 mg    2. Advanced atrophic nonexudative age-related macular degeneration of right eye without subfoveal involvement  H35.3113     3. Essential hypertension  I10     4. Hypertensive retinopathy of both eyes  H35.033     5. Pseudophakia of both eyes  Z96.1      Exudative age related macular degeneration OS  - s/p IVA OS #1 (03.14.2025), #2 (04.11.25), #3 (05.13.25), #4 (06.17.25)  - BCVA OS 20/50 - stable  - OCT shows Central CNV with interval improvement in IRF, SRF  and central SRHM   - recommend IVA OS #5 today, 07.15.25 - pt wishes to be treated with IVA - RBA of procedure discussed, questions answered - informed consent obtained and signed 03.14.25 - see procedure note **discussed decreased efficacy / resistance to Avastin  and potential benefit of switching medication**  - okay for all treatments / meds (Medicare and BCFed)  - f/u in 4 wks -- DFE/OCT, possible injection  2. Age related macular  degeneration, non-exudative, OD  - advanced stage with early GA (noncentral)   - BCVA OD 20/25 today.  - The incidence, anatomy, and pathology of dry AMD, risk of progression, and the AREDS and AREDS 2 study including smoking risks discussed with patient.  - Recommend amsler grid monitoring  - f/u 4 weeks  3.4. Hypertensive retinopathy OU - discussed importance of tight BP control - monitor  5. Psuedophakia OU  - beautiful surgeries   - doing well   Ophthalmic Meds Ordered this visit:  Meds ordered this encounter  Medications   Bevacizumab  (AVASTIN ) SOLN 1.25 mg     Return in about 4 weeks (around 08/15/2023) for exu ARMD OS, DFE, OCT, Possible Injxn.  There are no Patient Instructions on file for this visit.  This document serves as a record of services personally performed by Redell JUDITHANN Hans, MD, PhD. It was created on their behalf by Auston Muzzy, COMT. The creation of this record is the provider's dictation and/or activities during the visit.  Electronically signed by: Auston Muzzy, COMT 07/18/23 9:32 PM  This document serves as a record of services personally performed by Redell JUDITHANN Hans, MD, PhD. It was created on their behalf by Almetta Pesa, an ophthalmic technician. The creation of this record is the provider's dictation and/or activities during the visit.    Electronically signed by: Almetta Pesa, OA, 07/18/23  9:32 PM  Redell JUDITHANN Hans, M.D., Ph.D. Diseases & Surgery of the Retina and Vitreous [facility]  I have reviewed the above documentation for accuracy and completeness, and I agree with the above. Redell JUDITHANN Hans, M.D., Ph.D. 07/18/23 9:40 PM   Abbreviations: M myopia (nearsighted); A astigmatism; H hyperopia (farsighted); P presbyopia; Mrx spectacle prescription;  CTL contact lenses; OD right eye; OS left eye; OU both eyes  XT exotropia; ET esotropia; PEK punctate epithelial keratitis; PEE punctate epithelial erosions; DES dry  eye syndrome; MGD meibomian gland dysfunction; ATs artificial tears; PFAT's preservative free artificial tears; NSC nuclear sclerotic cataract; PSC posterior subcapsular cataract; ERM epi-retinal membrane; PVD posterior vitreous detachment; RD retinal detachment; DM diabetes mellitus; DR diabetic retinopathy; NPDR non-proliferative diabetic retinopathy; PDR proliferative diabetic retinopathy; CSME clinically significant macular edema; DME diabetic macular edema; dbh dot blot hemorrhages; CWS cotton wool spot; POAG primary open angle glaucoma; C/D cup-to-disc ratio; HVF humphrey visual field; GVF goldmann visual field; OCT optical coherence tomography; IOP intraocular pressure; BRVO Branch retinal vein occlusion; CRVO central retinal vein occlusion; CRAO central retinal artery occlusion; BRAO branch retinal artery occlusion; RT retinal tear; SB scleral buckle; PPV pars plana vitrectomy; VH Vitreous hemorrhage; PRP panretinal laser photocoagulation; IVK intravitreal kenalog; VMT vitreomacular traction; MH Macular hole;  NVD neovascularization of the disc; NVE neovascularization elsewhere; AREDS age related eye disease study; ARMD age related macular degeneration; POAG primary open angle glaucoma; EBMD epithelial/anterior basement membrane dystrophy; ACIOL anterior chamber intraocular lens; IOL intraocular lens; PCIOL posterior chamber intraocular lens; Phaco/IOL phacoemulsification with intraocular lens placement; PRK photorefractive keratectomy; LASIK laser assisted in situ keratomileusis; HTN hypertension;  DM diabetes mellitus; COPD chronic obstructive pulmonary disease

## 2023-07-18 ENCOUNTER — Ambulatory Visit (INDEPENDENT_AMBULATORY_CARE_PROVIDER_SITE_OTHER): Admitting: Ophthalmology

## 2023-07-18 ENCOUNTER — Encounter (INDEPENDENT_AMBULATORY_CARE_PROVIDER_SITE_OTHER): Payer: Self-pay | Admitting: Ophthalmology

## 2023-07-18 DIAGNOSIS — H35033 Hypertensive retinopathy, bilateral: Secondary | ICD-10-CM

## 2023-07-18 DIAGNOSIS — I10 Essential (primary) hypertension: Secondary | ICD-10-CM | POA: Diagnosis not present

## 2023-07-18 DIAGNOSIS — H353221 Exudative age-related macular degeneration, left eye, with active choroidal neovascularization: Secondary | ICD-10-CM

## 2023-07-18 DIAGNOSIS — Z961 Presence of intraocular lens: Secondary | ICD-10-CM

## 2023-07-18 DIAGNOSIS — H353113 Nonexudative age-related macular degeneration, right eye, advanced atrophic without subfoveal involvement: Secondary | ICD-10-CM

## 2023-07-18 MED ORDER — BEVACIZUMAB CHEMO INJECTION 1.25MG/0.05ML SYRINGE FOR KALEIDOSCOPE
1.2500 mg | INTRAVITREAL | Status: AC | PRN
  Administered 2023-07-18: 1.25 mg via INTRAVITREAL

## 2023-07-31 DIAGNOSIS — M810 Age-related osteoporosis without current pathological fracture: Secondary | ICD-10-CM | POA: Diagnosis not present

## 2023-07-31 DIAGNOSIS — J069 Acute upper respiratory infection, unspecified: Secondary | ICD-10-CM | POA: Diagnosis not present

## 2023-07-31 DIAGNOSIS — D485 Neoplasm of uncertain behavior of skin: Secondary | ICD-10-CM | POA: Diagnosis not present

## 2023-07-31 DIAGNOSIS — H3530 Unspecified macular degeneration: Secondary | ICD-10-CM | POA: Diagnosis not present

## 2023-08-01 NOTE — Progress Notes (Signed)
 [facility] - Clinic Note  08/15/2023   CHIEF COMPLAINT Patient presents for Retina Follow Up  HISTORY OF PRESENT ILLNESS: James Olson is a 73 y.o. male who presents to the clinic today for:  HPI     Retina Follow Up   Patient presents with  Wet AMD.  In left eye.  This started 4 weeks ago.  I, the attending physician,  performed the HPI with the patient and updated documentation appropriately.        Comments   Patient here for 4 weeks retina follow up for exu ARMD OS. Patient states vision OS gradually improving. Bright daylight sees through haze. Cloudy days sees through haze a little bit. Dark can't see through haze. No eye pain.       Last edited by [name], MD on 08/15/2023  5:47 PM.     Pt states he feels VA is a little bit better. Some central clearing and can see a bit better.   Referring physician: [name], MD [address] [place],  [place] [address]  HISTORICAL INFORMATION:  Selected notes from the MEDICAL RECORD NUMBER Referred by Dr. Fleeta for concern of new exu ARMD OS LEE:  Ocular Hx- PMH-   CURRENT MEDICATIONS: Current Outpatient Medications (Ophthalmic Drugs)  Medication Sig   dorzolamide  (TRUSOPT ) 2 % ophthalmic solution Place 1 drop into the right eye 2 (two) times daily.   No current facility-administered medications for this visit. (Ophthalmic Drugs)   Current Outpatient Medications (Other)  Medication Sig   albuterol (VENTOLIN HFA) 108 (90 Base) MCG/ACT inhaler Inhale 2 puffs into the lungs every 6 (six) hours as needed for wheezing or shortness of breath (takes after mowing grass).   alendronate (FOSAMAX) 70 MG tablet Take 70 mg by mouth once a week.   allopurinol  (ZYLOPRIM ) 300 MG tablet Take 300 mg by mouth daily.   amLODipine (NORVASC) 5 MG tablet Take 5 mg by mouth daily.   aspirin  EC 81 MG tablet Take 81 mg by mouth daily.   Cholecalciferol (VITAMIN D3 PO) Take 400 Units by mouth 2 (two)  times a day.   colchicine  0.6 MG tablet Take 1 tablet (0.6 mg total) by mouth daily. Only take if gout flare up   ezetimibe  (ZETIA ) 10 MG tablet Take 10 mg by mouth daily.   famotidine (PEPCID) 40 MG tablet Take 40 mg by mouth at bedtime.   fluocinonide  cream (LIDEX ) 0.05 % Apply 1 application topically 2 (two) times daily as needed (dry skin).    fluticasone (FLONASE) 50 MCG/ACT nasal spray Place 1 spray into both nostrils as needed for allergies or rhinitis (congestion).   magnesium  oxide (MAG-OX) 400 MG tablet Take 400 mg by mouth 2 (two) times daily.   Magnesium  Oxide 400 (240 Mg) MG TABS Take 1-2 tablets by mouth daily.   omeprazole (PRILOSEC) 40 MG capsule Take 40 mg by mouth every other day.    OVER THE COUNTER MEDICATION Take 1 tablet by mouth daily. Macular Degeneration Supplement   rosuvastatin (CRESTOR) 20 MG tablet Take 20 mg by mouth daily.   telmisartan (MICARDIS) 80 MG tablet Take 80 mg by mouth daily.   No current facility-administered medications for this visit. (Other)   REVIEW OF SYSTEMS: ROS   Positive for: Cardiovascular, Eyes, Allergic/Imm Negative for: Constitutional, Gastrointestinal, Neurological, Skin, Genitourinary, Musculoskeletal, HENT, Endocrine, Respiratory, Psychiatric, Heme/Lymph Last edited by Orval Asberry RAMAN, COA on 08/15/2023  9:19 AM.  ALLERGIES Allergies  Allergen Reactions   Ace Inhibitors Cough   Aspirin  Other (See Comments)    Can take enteric coated but not the regular. GI issues   Benicar [Olmesartan Medoxomil] Cough   Other     Preservative in some eye drops. Causes eyes to blister.    PAST MEDICAL HISTORY Past Medical History:  Diagnosis Date   Abnormal stress test    H/O   Atypical chest pain    Diverticulosis    H/O   GERD (gastroesophageal reflux disease)    Hyperlipidemia    Hypertension    Obesity    RBBB    Past Surgical History:  Procedure Laterality Date   CARDIAC CATHETERIZATION  11/26/08   EF 55%   WITH DR. TISA   COLONOSCOPY  03/01/07   W/POLYPECTOMY AND BX   GALLBLADDER SURGERY     PERCUTANEOUS PINNING PHALANX FRACTURE OF HAND     FAMILY HISTORY Family History  Problem Relation Age of Onset   Arrhythmia Mother         HAS PACEMAKER   Atrial fibrillation Mother        PAF   Sudden death Father 59   SOCIAL HISTORY Social History   Tobacco Use   Smoking status: Never   Smokeless tobacco: Never  Vaping Use   Vaping status: Never Used  Substance Use Topics   Alcohol use: No   Drug use: No       OPHTHALMIC EXAM:  Base Eye Exam     Visual Acuity (Snellen - Linear)       Right Left   Dist cc 20/20 20/50 -2    Correction: Glasses         Tonometry (Tonopen, 9:15 AM)       Right Left   Pressure 11 13         Pupils       Dark Light Shape React APD   Right 3 2 Round Brisk None   Left 3 2 Round Brisk None         Visual Fields (Counting fingers)       Left Right    Full Full         Extraocular Movement       Right Left    Full, Ortho Full, Ortho         Neuro/Psych     Oriented x3: Yes   Mood/Affect: Normal         Dilation     Both eyes: 1.0% Mydriacyl, 2.5% Phenylephrine @ 9:15 AM           Slit Lamp and Fundus Exam     External Exam       Right Left   External Normal Normal         Slit Lamp Exam       Right Left   Lids/Lashes dermatochalasism, mild MGD dermatochalasis, mild MGD   Conjunctiva/Sclera white and quiet white and quiet   Cornea tear film debris, well healed cataract woun tear film debris, well healed cataract woun   Anterior Chamber deep and clear deep and clear   Iris round and dilated round and dilated   Lens PCIOL perfect positoin, open PC PCIOL perfect positoin, open PC   Anterior Vitreous syneresis, PVD syneresis, PVD         Fundus Exam       Right Left   Disc mild pallor, sharp rim, PPA mild pallor, sharp rim, mild PPA  C/D Ratio 0.4 0.4   Macula flat, blunted foveal reflex,  drusen, RPE mottling, clumping, and atrophy, no heme or edema blunted foveal reflex, central CNV with +heme --improved, mild increase in central cystic changes, drusen, RPE mottling, clumping, and atrophy, early subretinal fibrosis centrally.   Vessels attenuated and tortuous attenuated and mild tortuosity   Periphery attached, reticular degenration, peripheral drusen, pavingstone degeneration, no heme attached, no heme, reticular degeneration, scattered drusen, scattered pavingstone degeneration           Refraction     Wearing Rx       Sphere Cylinder Axis Add   Right -1.25 +1.00 015 +2.25   Left -0.25 +1.00 177 +2.25           IMAGING AND PROCEDURES  Imaging and Procedures for 08/15/2023  OCT, Retina - OU - Both Eyes       Right Eye Quality was good. Central Foveal Thickness: 343. Progression has been stable. Findings include normal foveal contour, no IRF, no SRF, retinal drusen , epiretinal membrane, macular pucker, pigment epithelial detachment, outer retinal atrophy (Diffuse drusen, early GA; mild ERM with pucker).   Left Eye Quality was good. Central Foveal Thickness: 374. Progression has worsened. Findings include abnormal foveal contour, subretinal hyper-reflective material, intraretinal fluid, subretinal fluid (Central CNV with stable improvement in SRF and central SRHM; Mild interval increase in IRF. ).   Notes *Images captured and stored on drive  Diagnosis / Impression:  OD: non-exudative ARMD with diffuse drusen, early GA; mild ERM with pucker OS: exudative ARMD - Central CNV with stable improvement in SRF and central SRHM; Mild interval increase in IRF.   Clinical management:  See below  Abbreviations: NFP - Normal foveal profile. CME - cystoid macular edema. PED - pigment epithelial detachment. IRF - intraretinal fluid. SRF - subretinal fluid. EZ - ellipsoid zone. ERM - epiretinal membrane. ORA - outer retinal atrophy. ORT - outer retinal tubulation. SRHM -  subretinal hyper-reflective material. IRHM - intraretinal hyper-reflective material      Intravitreal Injection, Pharmacologic Agent - OS - Left Eye       Time Out 08/15/2023. 10:33 AM. Confirmed correct patient, procedure, site, and patient consented.   Anesthesia Topical anesthesia was used. Anesthetic medications included Lidocaine 2%, Proparacaine 0.5%.   Procedure Preparation included 5% betadine to ocular surface, eyelid speculum. A supplied (32g) needle was used.   Injection: 2 mg aflibercept  2 MG/0.05ML   Route: Intravitreal, Site: Left Eye   NDC: [id number], Lot: [id number], Expiration date: 11/01/2024, Waste: 0 mL   Post-op Post injection exam found visual acuity of at least counting fingers. The patient tolerated the procedure well. There were no complications. The patient received written and verbal post procedure care education.            ASSESSMENT/PLAN:   ICD-10-CM   1. Exudative age-related macular degeneration of left eye with active choroidal neovascularization (HCC)  H35.3221 OCT, Retina - OU - Both Eyes    Intravitreal Injection, Pharmacologic Agent - OS - Left Eye    aflibercept  (EYLEA ) SOLN 2 mg    2. Advanced atrophic nonexudative age-related macular degeneration of right eye without subfoveal involvement  H35.3113     3. Essential hypertension  I10     4. Hypertensive retinopathy of both eyes  H35.033     5. Pseudophakia of both eyes  Z96.1       Exudative age related macular degeneration OS  - s/p IVA OS #1 (03.14.2025), #2 (04.11.25), #3 (  05.13.25), #4 (06.17.25), #5 (07.15.25)  - BCVA OS 20/50 - stable  - OCT shows Central CNV with stable improvement in SRF and central SRHM; Mild interval increase in IRF at 4 weeks.  **discussed decreased efficacy / resistance to Avastin  and potential benefit of switching medication**  - okay for all treatments / meds (Medicare and BCFed)  - recommend switching to IVE OS #1 (08.12.25) today - pt wishes  to be treated with IVA - RBA of procedure discussed, questions answered - IVE informed consent obtained and signed 08.12.25 - IVA informed consent obtained and signed 03.14.25 - see procedure note  - f/u in 4 wks -- DFE/OCT, possible injection  2. Age related macular degeneration, non-exudative, OD  - advanced stage with early GA (noncentral)   - BCVA OD 20/20 today.  - The incidence, anatomy, and pathology of dry AMD, risk of progression, and the AREDS and AREDS 2 study including smoking risks discussed with patient.  - Recommend amsler grid monitoring  - f/u 4 weeks  3.4. Hypertensive retinopathy OU - discussed importance of tight BP control - monitor  5. Psuedophakia OU  - beautiful surgeries   - doing well   Ophthalmic Meds Ordered this visit:  Meds ordered this encounter  Medications   aflibercept  (EYLEA ) SOLN 2 mg     Return in about 4 weeks (around 09/12/2023) for exu ARMD OS, DFE, OCT, Possible Injxn.  There are no Patient Instructions on file for this visit.  This document serves as a record of services personally performed by Redell JUDITHANN Hans, MD, PhD. It was created on their behalf by Avelina Pereyra, COA an ophthalmic technician. The creation of this record is the provider's dictation and/or activities during the visit.   Electronically signed by: Avelina GORMAN Pereyra, COT  08/20/23  12:47 AM    Redell JUDITHANN Hans, M.D., Ph.D. Diseases & Surgery of the Retina and Vitreous [facility]  I have reviewed the above documentation for accuracy and completeness, and I agree with the above. Redell JUDITHANN Hans, M.D., Ph.D. 08/20/23 12:49 AM   Abbreviations: M myopia (nearsighted); A astigmatism; H hyperopia (farsighted); P presbyopia; Mrx spectacle prescription;  CTL contact lenses; OD right eye; OS left eye; OU both eyes  XT exotropia; ET esotropia; PEK punctate epithelial keratitis; PEE punctate epithelial erosions; DES dry eye syndrome; MGD meibomian gland  dysfunction; ATs artificial tears; PFAT's preservative free artificial tears; NSC nuclear sclerotic cataract; PSC posterior subcapsular cataract; ERM epi-retinal membrane; PVD posterior vitreous detachment; RD retinal detachment; DM diabetes mellitus; DR diabetic retinopathy; NPDR non-proliferative diabetic retinopathy; PDR proliferative diabetic retinopathy; CSME clinically significant macular edema; DME diabetic macular edema; dbh dot blot hemorrhages; CWS cotton wool spot; POAG primary open angle glaucoma; C/D cup-to-disc ratio; HVF humphrey visual field; GVF goldmann visual field; OCT optical coherence tomography; IOP intraocular pressure; BRVO Branch retinal vein occlusion; CRVO central retinal vein occlusion; CRAO central retinal artery occlusion; BRAO branch retinal artery occlusion; RT retinal tear; SB scleral buckle; PPV pars plana vitrectomy; VH Vitreous hemorrhage; PRP panretinal laser photocoagulation; IVK intravitreal kenalog; VMT vitreomacular traction; MH Macular hole;  NVD neovascularization of the disc; NVE neovascularization elsewhere; AREDS age related eye disease study; ARMD age related macular degeneration; POAG primary open angle glaucoma; EBMD epithelial/anterior basement membrane dystrophy; ACIOL anterior chamber intraocular lens; IOL intraocular lens; PCIOL posterior chamber intraocular lens; Phaco/IOL phacoemulsification with intraocular lens placement; PRK photorefractive keratectomy; LASIK laser assisted in situ keratomileusis; HTN hypertension; DM diabetes mellitus; COPD chronic  obstructive pulmonary disease

## 2023-08-15 ENCOUNTER — Encounter (INDEPENDENT_AMBULATORY_CARE_PROVIDER_SITE_OTHER): Payer: Self-pay | Admitting: Ophthalmology

## 2023-08-15 ENCOUNTER — Ambulatory Visit (INDEPENDENT_AMBULATORY_CARE_PROVIDER_SITE_OTHER): Admitting: Ophthalmology

## 2023-08-15 DIAGNOSIS — H353113 Nonexudative age-related macular degeneration, right eye, advanced atrophic without subfoveal involvement: Secondary | ICD-10-CM | POA: Diagnosis not present

## 2023-08-15 DIAGNOSIS — I10 Essential (primary) hypertension: Secondary | ICD-10-CM

## 2023-08-15 DIAGNOSIS — H35033 Hypertensive retinopathy, bilateral: Secondary | ICD-10-CM

## 2023-08-15 DIAGNOSIS — Z961 Presence of intraocular lens: Secondary | ICD-10-CM | POA: Diagnosis not present

## 2023-08-15 DIAGNOSIS — H353221 Exudative age-related macular degeneration, left eye, with active choroidal neovascularization: Secondary | ICD-10-CM | POA: Diagnosis not present

## 2023-08-15 MED ORDER — AFLIBERCEPT 2MG/0.05ML IZ SOLN FOR KALEIDOSCOPE
2.0000 mg | INTRAVITREAL | Status: AC | PRN
  Administered 2023-08-15 (×2): 2 mg via INTRAVITREAL

## 2023-08-16 DIAGNOSIS — L821 Other seborrheic keratosis: Secondary | ICD-10-CM | POA: Diagnosis not present

## 2023-08-16 DIAGNOSIS — D485 Neoplasm of uncertain behavior of skin: Secondary | ICD-10-CM | POA: Diagnosis not present

## 2023-08-16 DIAGNOSIS — C44329 Squamous cell carcinoma of skin of other parts of face: Secondary | ICD-10-CM | POA: Diagnosis not present

## 2023-08-31 NOTE — Progress Notes (Signed)
 [facility] - Clinic Note  09/12/2023   CHIEF COMPLAINT Patient presents for Retina Follow Up  HISTORY OF PRESENT ILLNESS: James Olson is a 73 y.o. male who presents to the clinic today for:  HPI     Retina Follow Up   Patient presents with  Wet AMD.  In left eye.  Severity is moderate.  Duration of 4 weeks.  Since onset it is stable.  I, the attending physician,  performed the HPI with the patient and updated documentation appropriately.        Comments   4 week Retina eval. Patient states vision seems a little better      Last edited by [name], MD on 09/12/2023 12:12 PM.      Patient feels the vision is getting a little better.   Referring physician: [name], MD [address] [place],  [place] [address]  HISTORICAL INFORMATION:  Selected notes from the MEDICAL RECORD NUMBER Referred by Dr. Fleeta for concern of new exu ARMD OS LEE:  Ocular Hx- PMH-   CURRENT MEDICATIONS: Current Outpatient Medications (Ophthalmic Drugs)  Medication Sig   dorzolamide  (TRUSOPT ) 2 % ophthalmic solution Place 1 drop into the right eye 2 (two) times daily.   No current facility-administered medications for this visit. (Ophthalmic Drugs)   Current Outpatient Medications (Other)  Medication Sig   albuterol (VENTOLIN HFA) 108 (90 Base) MCG/ACT inhaler Inhale 2 puffs into the lungs every 6 (six) hours as needed for wheezing or shortness of breath (takes after mowing grass).   alendronate (FOSAMAX) 70 MG tablet Take 70 mg by mouth once a week.   allopurinol  (ZYLOPRIM ) 300 MG tablet Take 300 mg by mouth daily.   amLODipine (NORVASC) 5 MG tablet Take 5 mg by mouth daily.   aspirin  EC 81 MG tablet Take 81 mg by mouth daily.   Cholecalciferol (VITAMIN D3 PO) Take 400 Units by mouth 2 (two) times a day.   colchicine  0.6 MG tablet Take 1 tablet (0.6 mg total) by mouth daily. Only take if gout flare up   ezetimibe  (ZETIA ) 10 MG tablet Take 10 mg by  mouth daily.   famotidine (PEPCID) 40 MG tablet Take 40 mg by mouth at bedtime.   fluocinonide  cream (LIDEX ) 0.05 % Apply 1 application topically 2 (two) times daily as needed (dry skin).    fluticasone (FLONASE) 50 MCG/ACT nasal spray Place 1 spray into both nostrils as needed for allergies or rhinitis (congestion).   magnesium  oxide (MAG-OX) 400 MG tablet Take 400 mg by mouth 2 (two) times daily.   Magnesium  Oxide 400 (240 Mg) MG TABS Take 1-2 tablets by mouth daily.   omeprazole (PRILOSEC) 40 MG capsule Take 40 mg by mouth every other day.    OVER THE COUNTER MEDICATION Take 1 tablet by mouth daily. Macular Degeneration Supplement   rosuvastatin (CRESTOR) 20 MG tablet Take 20 mg by mouth daily.   telmisartan (MICARDIS) 80 MG tablet Take 80 mg by mouth daily.   No current facility-administered medications for this visit. (Other)   REVIEW OF SYSTEMS: ROS   Positive for: Cardiovascular, Eyes, Allergic/Imm Negative for: Constitutional, Gastrointestinal, Neurological, Skin, Genitourinary, Musculoskeletal, HENT, Endocrine, Respiratory, Psychiatric, Heme/Lymph Last edited by German Olam BRAVO, COT on 09/12/2023  9:03 AM.           ALLERGIES Allergies  Allergen Reactions   Ace Inhibitors Cough   Aspirin  Other (See Comments)    Can take enteric coated but  not the regular. GI issues   Benicar [Olmesartan Medoxomil] Cough   Other     Preservative in some eye drops. Causes eyes to blister.    PAST MEDICAL HISTORY Past Medical History:  Diagnosis Date   Abnormal stress test    H/O   Atypical chest pain    Diverticulosis    H/O   GERD (gastroesophageal reflux disease)    Hyperlipidemia    Hypertension    Obesity    RBBB    Past Surgical History:  Procedure Laterality Date   CARDIAC CATHETERIZATION  11/26/08   EF 55%  WITH DR. TISA   COLONOSCOPY  03/01/07   W/POLYPECTOMY AND BX   GALLBLADDER SURGERY     PERCUTANEOUS PINNING PHALANX FRACTURE OF HAND     FAMILY  HISTORY Family History  Problem Relation Age of Onset   Arrhythmia Mother         HAS PACEMAKER   Atrial fibrillation Mother        PAF   Sudden death Father 52   SOCIAL HISTORY Social History   Tobacco Use   Smoking status: Never   Smokeless tobacco: Never  Vaping Use   Vaping status: Never Used  Substance Use Topics   Alcohol use: No   Drug use: No       OPHTHALMIC EXAM:  Base Eye Exam     Visual Acuity (Snellen - Linear)       Right Left   Dist cc 20/20 -1 20/100 -2   Dist ph cc  20/NI    Correction: Glasses         Tonometry (Tonopen, 9:05 AM)       Right Left   Pressure 10 11         Pupils       Dark Light Shape React APD   Right 3 2 Round Brisk None   Left 3 2 Round Brisk None         Visual Fields (Counting fingers)       Left Right    Full Full         Extraocular Movement       Right Left    Full, Ortho Full, Ortho         Neuro/Psych     Oriented x3: Yes   Mood/Affect: Normal         Dilation     Both eyes: 1.0% Mydriacyl, 2.5% Phenylephrine @ 9:05 AM           Slit Lamp and Fundus Exam     External Exam       Right Left   External Normal Normal         Slit Lamp Exam       Right Left   Lids/Lashes dermatochalasism, mild MGD dermatochalasis, mild MGD   Conjunctiva/Sclera white and quiet white and quiet   Cornea tear film debris, well healed cataract woun tear film debris, well healed cataract woun   Anterior Chamber deep and clear deep and clear   Iris round and dilated round and dilated   Lens PCIOL perfect positoin, open PC PCIOL perfect positoin, open PC   Anterior Vitreous syneresis, PVD syneresis, PVD         Fundus Exam       Right Left   Disc mild pallor, sharp rim, PPA mild pallor, sharp rim, mild PPA   C/D Ratio 0.4 0.4   Macula flat, blunted foveal reflex, drusen, RPE mottling,  clumping, and atrophy, no heme or edema blunted foveal reflex, central CNV with +heme --improved,  central cystic changes- improved, drusen, RPE mottling, clumping, and atrophy, mild subretinal fibrosis centrally.   Vessels attenuated and tortuous attenuated and mild tortuosity   Periphery attached, reticular degenration, peripheral drusen, pavingstone degeneration, no heme attached, no heme, reticular degeneration, scattered drusen, scattered pavingstone degeneration           Refraction     Wearing Rx       Sphere Cylinder Axis Add   Right -1.25 +1.00 015 +2.25   Left -0.25 +1.00 177 +2.25         Manifest Refraction (Auto)       Sphere Cylinder Axis Dist VA   Right       Left -1.00 +1.50 004 20/60-2           IMAGING AND PROCEDURES  Imaging and Procedures for 09/12/2023  OCT, Retina - OU - Both Eyes       Right Eye Quality was good. Central Foveal Thickness: 336. Progression has been stable. Findings include normal foveal contour, no IRF, no SRF, retinal drusen , epiretinal membrane, macular pucker, pigment epithelial detachment, outer retinal atrophy (Diffuse drusen, early GA; mild ERM with pucker).   Left Eye Quality was good. Central Foveal Thickness: 330. Progression has improved. Findings include abnormal foveal contour, subretinal hyper-reflective material, intraretinal fluid, subretinal fluid (Central CNV with stable improvement in SRF and mild interval improvement in central [facility] and cystic changes overlying).   Notes *Images captured and stored on drive  Diagnosis / Impression:  OD: non-exudative ARMD with diffuse drusen, early GA; mild ERM with pucker OS: exudative ARMD - Central CNV with stable improvement in SRF and mild interval improvement in central [facility] and cystic changes overlying  Clinical management:  See below  Abbreviations: NFP - Normal foveal profile. CME - cystoid macular edema. PED - pigment epithelial detachment. IRF - intraretinal fluid. SRF - subretinal fluid. EZ - ellipsoid zone. ERM - epiretinal membrane. ORA - outer retinal  atrophy. ORT - outer retinal tubulation. SRHM - subretinal hyper-reflective material. IRHM - intraretinal hyper-reflective material      Intravitreal Injection, Pharmacologic Agent - OS - Left Eye       Time Out 09/12/2023. 9:45 AM. Confirmed correct patient, procedure, site, and patient consented.   Anesthesia Topical anesthesia was used. Anesthetic medications included Lidocaine 2%, Proparacaine 0.5%.   Procedure Preparation included 5% betadine to ocular surface, eyelid speculum. A (32g) needle was used.   Injection: 2 mg aflibercept  2 MG/0.05ML   Route: Intravitreal, Site: Left Eye   NDC: [id number], Lot: [id number], Expiration date: 11/01/2024, Waste: 0 mL   Post-op Post injection exam found visual acuity of at least counting fingers. The patient tolerated the procedure well. There were no complications. The patient received written and verbal post procedure care education.             ASSESSMENT/PLAN:   ICD-10-CM   1. Exudative age-related macular degeneration of left eye with active choroidal neovascularization (HCC)  H35.3221 OCT, Retina - OU - Both Eyes    Intravitreal Injection, Pharmacologic Agent - OS - Left Eye    aflibercept  (EYLEA ) SOLN 2 mg    2. Advanced atrophic nonexudative age-related macular degeneration of right eye without subfoveal involvement  H35.3113     3. Essential hypertension  I10     4. Hypertensive retinopathy of both eyes  H35.033     5. Pseudophakia of both eyes  Z96.1       Exudative age related macular degeneration OS - s/p IVA OS #1 (03.14.2025), #2 (04.11.25), #3 (05.13.25), #4 (06.17.25), #5 (07.15.25) -- IVA resistance ==========================  - s/p IVE OS #1 (08.12.25)  - BCVA OS 20/100 from 20/50 - OCT shows Central CNV with stable improvement in SRF and mild interval improvement in central [facility] and cystic changes overlying at 4 weeks. -- good response to IVE - okay for all treatments / meds (Medicare and BCFed)  -  recommend IVE OS #2 (09.09.25) today w/ f/u in 4 wks - pt wishes to be treated with IVA - RBA of procedure discussed, questions answered - IVE informed consent obtained and signed 08.12.25 - IVA informed consent obtained and signed 03.14.25 - see procedure note  - f/u in 4 wks -- DFE/OCT, possible injection  2. Age related macular degeneration, non-exudative, OD  - advanced stage with early GA (noncentral)   - BCVA OD 20/20 today. - The incidence, anatomy, and pathology of dry AMD, risk of progression, and the AREDS and AREDS 2 study including smoking risks discussed with patient.  - Recommend amsler grid monitoring  - f/u 4 weeks  3.4. Hypertensive retinopathy OU - discussed importance of tight BP control - monitor  5. Psuedophakia OU  - beautiful surgeries   - doing well  Ophthalmic Meds Ordered this visit:  Meds ordered this encounter  Medications   aflibercept  (EYLEA ) SOLN 2 mg     Return in about 4 weeks (around 10/10/2023) for f/u, Ex. AMD, DFE, OCT, Possible, IVE, OS.  There are no Patient Instructions on file for this visit.  This document serves as a record of services personally performed by Redell JUDITHANN Hans, MD, PhD. It was created on their behalf by Wanda GEANNIE Keens, COT an ophthalmic technician. The creation of this record is the provider's dictation and/or activities during the visit.    Electronically signed by:  Wanda GEANNIE Keens, COT  09/12/23 12:13 PM  Redell JUDITHANN Hans, M.D., Ph.D. Diseases & Surgery of the Retina and Vitreous [facility]   I have reviewed the above documentation for accuracy and completeness, and I agree with the above. Redell JUDITHANN Hans, M.D., Ph.D. 09/12/23 12:14 PM   Abbreviations: M myopia (nearsighted); A astigmatism; H hyperopia (farsighted); P presbyopia; Mrx spectacle prescription;  CTL contact lenses; OD right eye; OS left eye; OU both eyes  XT exotropia; ET esotropia; PEK punctate epithelial keratitis;  PEE punctate epithelial erosions; DES dry eye syndrome; MGD meibomian gland dysfunction; ATs artificial tears; PFAT's preservative free artificial tears; NSC nuclear sclerotic cataract; PSC posterior subcapsular cataract; ERM epi-retinal membrane; PVD posterior vitreous detachment; RD retinal detachment; DM diabetes mellitus; DR diabetic retinopathy; NPDR non-proliferative diabetic retinopathy; PDR proliferative diabetic retinopathy; CSME clinically significant macular edema; DME diabetic macular edema; dbh dot blot hemorrhages; CWS cotton wool spot; POAG primary open angle glaucoma; C/D cup-to-disc ratio; HVF humphrey visual field; GVF goldmann visual field; OCT optical coherence tomography; IOP intraocular pressure; BRVO Branch retinal vein occlusion; CRVO central retinal vein occlusion; CRAO central retinal artery occlusion; BRAO branch retinal artery occlusion; RT retinal tear; SB scleral buckle; PPV pars plana vitrectomy; VH Vitreous hemorrhage; PRP panretinal laser photocoagulation; IVK intravitreal kenalog; VMT vitreomacular traction; MH Macular hole;  NVD neovascularization of the disc; NVE neovascularization elsewhere; AREDS age related eye disease study; ARMD age related macular degeneration; POAG primary open angle glaucoma; EBMD epithelial/anterior basement membrane dystrophy; ACIOL anterior chamber intraocular lens; IOL intraocular  lens; PCIOL posterior chamber intraocular lens; Phaco/IOL phacoemulsification with intraocular lens placement; PRK photorefractive keratectomy; LASIK laser assisted in situ keratomileusis; HTN hypertension; DM diabetes mellitus; COPD chronic obstructive pulmonary disease

## 2023-09-12 ENCOUNTER — Encounter (INDEPENDENT_AMBULATORY_CARE_PROVIDER_SITE_OTHER): Payer: Self-pay | Admitting: Ophthalmology

## 2023-09-12 ENCOUNTER — Ambulatory Visit (INDEPENDENT_AMBULATORY_CARE_PROVIDER_SITE_OTHER): Admitting: Ophthalmology

## 2023-09-12 DIAGNOSIS — H35033 Hypertensive retinopathy, bilateral: Secondary | ICD-10-CM

## 2023-09-12 DIAGNOSIS — I10 Essential (primary) hypertension: Secondary | ICD-10-CM

## 2023-09-12 DIAGNOSIS — H353221 Exudative age-related macular degeneration, left eye, with active choroidal neovascularization: Secondary | ICD-10-CM | POA: Diagnosis not present

## 2023-09-12 DIAGNOSIS — Z961 Presence of intraocular lens: Secondary | ICD-10-CM | POA: Diagnosis not present

## 2023-09-12 DIAGNOSIS — H353113 Nonexudative age-related macular degeneration, right eye, advanced atrophic without subfoveal involvement: Secondary | ICD-10-CM

## 2023-09-12 MED ORDER — AFLIBERCEPT 2MG/0.05ML IZ SOLN FOR KALEIDOSCOPE
2.0000 mg | INTRAVITREAL | Status: AC | PRN
  Administered 2023-09-12: 2 mg via INTRAVITREAL

## 2023-09-14 DIAGNOSIS — H40012 Open angle with borderline findings, low risk, left eye: Secondary | ICD-10-CM | POA: Diagnosis not present

## 2023-09-14 DIAGNOSIS — H353114 Nonexudative age-related macular degeneration, right eye, advanced atrophic with subfoveal involvement: Secondary | ICD-10-CM | POA: Diagnosis not present

## 2023-09-14 DIAGNOSIS — H353221 Exudative age-related macular degeneration, left eye, with active choroidal neovascularization: Secondary | ICD-10-CM | POA: Diagnosis not present

## 2023-09-14 DIAGNOSIS — H401112 Primary open-angle glaucoma, right eye, moderate stage: Secondary | ICD-10-CM | POA: Diagnosis not present

## 2023-09-26 NOTE — Progress Notes (Addendum)
 [facility] - Clinic Note  10/10/2023   CHIEF COMPLAINT Patient presents for Retina Follow Up  HISTORY OF PRESENT ILLNESS: James Olson is a 73 y.o. male who presents to the clinic today for:  HPI     Retina Follow Up   Patient presents with  Wet AMD.  In left eye.  This started 4 weeks ago.  Duration of 4 weeks.  Since onset it is stable.  I, the attending physician,  performed the HPI with the patient and updated documentation appropriately.        Comments   4 week retina follow up AMD OS and I'VE OS pt feels vision is slowly improving still has a haze denies flashes floaters after injection       Last edited by James Rogue, MD on 10/11/2023 12:15 AM.      Patient states he feels vision is gradually clearing up.   Referring physician: [name], MD [address] [place],  [place] [address]  HISTORICAL INFORMATION:  Selected notes from the MEDICAL RECORD NUMBER Referred by Dr. Fleeta for concern of new exu ARMD OS LEE:  Ocular Hx- PMH-   CURRENT MEDICATIONS: Current Outpatient Medications (Ophthalmic Drugs)  Medication Sig   dorzolamide  (TRUSOPT ) 2 % ophthalmic solution Place 1 drop into the right eye 2 (two) times daily.   No current facility-administered medications for this visit. (Ophthalmic Drugs)   Current Outpatient Medications (Other)  Medication Sig   albuterol (VENTOLIN HFA) 108 (90 Base) MCG/ACT inhaler Inhale 2 puffs into the lungs every 6 (six) hours as needed for wheezing or shortness of breath (takes after mowing grass).   alendronate (FOSAMAX) 70 MG tablet Take 70 mg by mouth once a week.   allopurinol  (ZYLOPRIM ) 300 MG tablet Take 300 mg by mouth daily.   amLODipine (NORVASC) 5 MG tablet Take 5 mg by mouth daily.   aspirin  EC 81 MG tablet Take 81 mg by mouth daily.   Cholecalciferol (VITAMIN D3 PO) Take 400 Units by mouth 2 (two) times a day.   colchicine  0.6 MG tablet Take 1 tablet (0.6 mg total) by mouth  daily. Only take if gout flare up   ezetimibe  (ZETIA ) 10 MG tablet Take 10 mg by mouth daily.   famotidine (PEPCID) 40 MG tablet Take 40 mg by mouth at bedtime.   fluocinonide  cream (LIDEX ) 0.05 % Apply 1 application topically 2 (two) times daily as needed (dry skin).    fluticasone (FLONASE) 50 MCG/ACT nasal spray Place 1 spray into both nostrils as needed for allergies or rhinitis (congestion).   magnesium  oxide (MAG-OX) 400 MG tablet Take 400 mg by mouth 2 (two) times daily.   Magnesium  Oxide 400 (240 Mg) MG TABS Take 1-2 tablets by mouth daily.   omeprazole (PRILOSEC) 40 MG capsule Take 40 mg by mouth every other day.    OVER THE COUNTER MEDICATION Take 1 tablet by mouth daily. Macular Degeneration Supplement   rosuvastatin (CRESTOR) 20 MG tablet Take 20 mg by mouth daily.   telmisartan (MICARDIS) 80 MG tablet Take 80 mg by mouth daily.   No current facility-administered medications for this visit. (Other)   REVIEW OF SYSTEMS: ROS   Positive for: Cardiovascular, Eyes, Allergic/Imm Negative for: Constitutional, Gastrointestinal, Neurological, Skin, Genitourinary, Musculoskeletal, HENT, Endocrine, Respiratory, Psychiatric, Heme/Lymph Last edited by Resa Delon ORN, COT on 10/10/2023  8:46 AM.     ALLERGIES Allergies  Allergen Reactions   Ace Inhibitors Cough   Aspirin   Other (See Comments)    Can take enteric coated but not the regular. GI issues   Benicar [Olmesartan Medoxomil] Cough   Other     Preservative in some eye drops. Causes eyes to blister.    PAST MEDICAL HISTORY Past Medical History:  Diagnosis Date   Abnormal stress test    H/O   Atypical chest pain    Diverticulosis    H/O   GERD (gastroesophageal reflux disease)    Hyperlipidemia    Hypertension    Obesity    RBBB    Past Surgical History:  Procedure Laterality Date   CARDIAC CATHETERIZATION  11/26/08   EF 55%  WITH DR. TISA   COLONOSCOPY  03/01/07   W/POLYPECTOMY AND BX   GALLBLADDER  SURGERY     PERCUTANEOUS PINNING PHALANX FRACTURE OF HAND     FAMILY HISTORY Family History  Problem Relation Age of Onset   Arrhythmia Mother         HAS PACEMAKER   Atrial fibrillation Mother        PAF   Sudden death Father 67   SOCIAL HISTORY Social History   Tobacco Use   Smoking status: Never   Smokeless tobacco: Never  Vaping Use   Vaping status: Never Used  Substance Use Topics   Alcohol use: No   Drug use: No       OPHTHALMIC EXAM:  Base Eye Exam     Visual Acuity (Snellen - Linear)       Right Left   Dist cc 20/20 -1 20/100   Dist ph cc  NI         Tonometry (Tonopen, 8:53 AM)       Right Left   Pressure 11 14         Pupils       Pupils Dark Light Shape React APD   Right PERRL 3 2 Round Brisk None   Left PERRL 3 2 Round Brisk None         Visual Fields       Left Right    Full Full         Extraocular Movement       Right Left    Full, Ortho Full, Ortho         Neuro/Psych     Oriented x3: Yes   Mood/Affect: Normal         Dilation     Both eyes: 2.5% Phenylephrine @ 8:53 AM           Slit Lamp and Fundus Exam     External Exam       Right Left   External Normal Normal         Slit Lamp Exam       Right Left   Lids/Lashes dermatochalasism, mild MGD dermatochalasis, mild MGD   Conjunctiva/Sclera white and quiet white and quiet   Cornea tear film debris, well healed cataract woun tear film debris, well healed cataract woun   Anterior Chamber deep and clear deep and clear   Iris round and dilated round and dilated   Lens PCIOL perfect positoin, open PC PCIOL perfect positoin, open PC   Anterior Vitreous syneresis, PVD syneresis, PVD         Fundus Exam       Right Left   Disc mild pallor, sharp rim, PPA mild pallor, sharp rim, mild PPA   C/D Ratio 0.4 0.4   Macula flat, blunted foveal  reflex, drusen, RPE mottling, clumping, and atrophy, no heme or edema blunted foveal reflex, central CNV with  +heme --improved, central cystic changes- improved, drusen, RPE mottling, clumping, and atrophy, mild subretinal fibrosis centrally.   Vessels attenuated and tortuous attenuated and mild tortuosity   Periphery attached, reticular degenration, peripheral drusen, pavingstone degeneration, no heme attached, no heme, reticular degeneration, scattered drusen, scattered pavingstone degeneration           Refraction     Wearing Rx       Sphere Cylinder Axis Add   Right -1.25 +1.00 015 +2.25   Left -0.25 +1.00 177 +2.25           IMAGING AND PROCEDURES  Imaging and Procedures for 10/10/2023  OCT, Retina - OU - Both Eyes       Right Eye Quality was good. Central Foveal Thickness: 345. Progression has been stable. Findings include normal foveal contour, no IRF, no SRF, retinal drusen , epiretinal membrane, macular pucker, pigment epithelial detachment, outer retinal atrophy (Diffuse drusen, early GA; mild ERM with pucker).   Left Eye Quality was good. Central Foveal Thickness: 324. Progression has improved. Findings include abnormal foveal contour, subretinal hyper-reflective material, intraretinal fluid, subretinal fluid (Central CNV with stable improvement in SRF and mild interval improvement in central [facility] and cystic changes overlying).   Notes *Images captured and stored on drive  Diagnosis / Impression:  OD: non-exudative ARMD with diffuse drusen, early GA; mild ERM with pucker OS: exudative ARMD - Central CNV with stable improvement in SRF and mild interval improvement in central [facility] and cystic changes overlying  Clinical management:  See below  Abbreviations: NFP - Normal foveal profile. CME - cystoid macular edema. PED - pigment epithelial detachment. IRF - intraretinal fluid. SRF - subretinal fluid. EZ - ellipsoid zone. ERM - epiretinal membrane. ORA - outer retinal atrophy. ORT - outer retinal tubulation. SRHM - subretinal hyper-reflective material. IRHM - intraretinal  hyper-reflective material      Intravitreal Injection, Pharmacologic Agent - OS - Left Eye       Time Out 10/10/2023. 9:42 AM. Confirmed correct patient, procedure, site, and patient consented.   Anesthesia Topical anesthesia was used. Anesthetic medications included Lidocaine 2%, Proparacaine 0.5%.   Procedure Preparation included 5% betadine to ocular surface, eyelid speculum. A (32g) needle was used.   Injection: 2 mg aflibercept  2 MG/0.05ML   Route: Intravitreal, Site: Left Eye   NDC: [id number], Lot: [id number], Expiration date: 12/02/2024, Waste: 0 mL   Post-op Post injection exam found visual acuity of at least counting fingers. The patient tolerated the procedure well. There were no complications. The patient received written and verbal post procedure care education.           ASSESSMENT/PLAN:   ICD-10-CM   1. Exudative age-related macular degeneration of left eye with active choroidal neovascularization (HCC)  H35.3221 OCT, Retina - OU - Both Eyes    Intravitreal Injection, Pharmacologic Agent - OS - Left Eye    aflibercept  (EYLEA ) SOLN 2 mg    2. Advanced atrophic nonexudative age-related macular degeneration of right eye without subfoveal involvement  H35.3113     3. Essential hypertension  I10     4. Hypertensive retinopathy of both eyes  H35.033     5. Pseudophakia of both eyes  Z96.1      Exudative age related macular degeneration OS - s/p IVA OS #1 (03.14.2025), #2 (04.11.25), #3 (05.13.25), #4 (06.17.25), #5 (07.15.25) -- IVA resistance ==========================  - s/p  IVE OS #1 (08.12.25),#2 (09.09.25)  - BCVA OS 20/100 from 20/50 - OCT shows Central CNV with stable improvement in SRF and mild interval improvement in central [facility] and cystic changes overlying at 4 weeks. -- good response to IVE - okay for all treatments / meds (Medicare and BCFed)  - recommend IVE OS #3 (10.07.25) today w/ f/u in 4 wks - pt wishes to be treated with IVA - RBA of  procedure discussed, questions answered - IVE informed consent obtained and signed 08.12.25 - IVA informed consent obtained and signed 03.14.25 - see procedure note  - f/u in 4 wks -- DFE/OCT, possible injection  2. Age related macular degeneration, non-exudative, OD  - advanced stage with early GA (noncentral)   - BCVA OD 20/20 today. - The incidence, anatomy, and pathology of dry AMD, risk of progression, and the AREDS and AREDS 2 study including smoking risks discussed with patient.  - Recommend amsler grid monitoring  - f/u 4 weeks  3.4. Hypertensive retinopathy OU - discussed importance of tight BP control - monitor  5. Psuedophakia OU  - beautiful surgeries   - doing well  Ophthalmic Meds Ordered this visit:  Meds ordered this encounter  Medications   aflibercept  (EYLEA ) SOLN 2 mg     Return in about 4 weeks (around 11/07/2023) for exu ARMD OS, DFE, OCT, Possible Injxn.  There are no Patient Instructions on file for this visit.  This document serves as a record of services personally performed by Redell JUDITHANN Hans, MD, PhD. It was created on their behalf by Wanda GEANNIE Keens, COT an ophthalmic technician. The creation of this record is the provider's dictation and/or activities during the visit.    Electronically signed by:  Wanda GEANNIE Keens, COT  10/15/23 1:48 PM  This document serves as a record of services personally performed by Redell JUDITHANN Hans, MD, PhD. It was created on their behalf by Almetta Pesa, an ophthalmic technician. The creation of this record is the provider's dictation and/or activities during the visit.    Electronically signed by: Almetta Pesa, OA, 10/15/23  1:48 PM  Redell JUDITHANN Hans, M.D., Ph.D. Diseases & Surgery of the Retina and Vitreous [facility]   I have reviewed the above documentation for accuracy and completeness, and I agree with the above. Redell JUDITHANN Hans, M.D., Ph.D. 10/15/23 1:48 PM    Abbreviations: M myopia (nearsighted); A astigmatism; H hyperopia (farsighted); P presbyopia; Mrx spectacle prescription;  CTL contact lenses; OD right eye; OS left eye; OU both eyes  XT exotropia; ET esotropia; PEK punctate epithelial keratitis; PEE punctate epithelial erosions; DES dry eye syndrome; MGD meibomian gland dysfunction; ATs artificial tears; PFAT's preservative free artificial tears; NSC nuclear sclerotic cataract; PSC posterior subcapsular cataract; ERM epi-retinal membrane; PVD posterior vitreous detachment; RD retinal detachment; DM diabetes mellitus; DR diabetic retinopathy; NPDR non-proliferative diabetic retinopathy; PDR proliferative diabetic retinopathy; CSME clinically significant macular edema; DME diabetic macular edema; dbh dot blot hemorrhages; CWS cotton wool spot; POAG primary open angle glaucoma; C/D cup-to-disc ratio; HVF humphrey visual field; GVF goldmann visual field; OCT optical coherence tomography; IOP intraocular pressure; BRVO Branch retinal vein occlusion; CRVO central retinal vein occlusion; CRAO central retinal artery occlusion; BRAO branch retinal artery occlusion; RT retinal tear; SB scleral buckle; PPV pars plana vitrectomy; VH Vitreous hemorrhage; PRP panretinal laser photocoagulation; IVK intravitreal kenalog; VMT vitreomacular traction; MH Macular hole;  NVD neovascularization of the disc; NVE neovascularization elsewhere; AREDS age related eye disease study;  ARMD age related macular degeneration; POAG primary open angle glaucoma; EBMD epithelial/anterior basement membrane dystrophy; ACIOL anterior chamber intraocular lens; IOL intraocular lens; PCIOL posterior chamber intraocular lens; Phaco/IOL phacoemulsification with intraocular lens placement; PRK photorefractive keratectomy; LASIK laser assisted in situ keratomileusis; HTN hypertension; DM diabetes mellitus; COPD chronic obstructive pulmonary disease

## 2023-10-02 DIAGNOSIS — Z23 Encounter for immunization: Secondary | ICD-10-CM | POA: Diagnosis not present

## 2023-10-10 ENCOUNTER — Ambulatory Visit (INDEPENDENT_AMBULATORY_CARE_PROVIDER_SITE_OTHER): Admitting: Ophthalmology

## 2023-10-10 ENCOUNTER — Encounter (INDEPENDENT_AMBULATORY_CARE_PROVIDER_SITE_OTHER): Payer: Self-pay | Admitting: Ophthalmology

## 2023-10-10 DIAGNOSIS — I10 Essential (primary) hypertension: Secondary | ICD-10-CM

## 2023-10-10 DIAGNOSIS — Z961 Presence of intraocular lens: Secondary | ICD-10-CM

## 2023-10-10 DIAGNOSIS — H353221 Exudative age-related macular degeneration, left eye, with active choroidal neovascularization: Secondary | ICD-10-CM | POA: Diagnosis not present

## 2023-10-10 DIAGNOSIS — H353113 Nonexudative age-related macular degeneration, right eye, advanced atrophic without subfoveal involvement: Secondary | ICD-10-CM

## 2023-10-10 DIAGNOSIS — H35033 Hypertensive retinopathy, bilateral: Secondary | ICD-10-CM

## 2023-10-11 ENCOUNTER — Encounter (INDEPENDENT_AMBULATORY_CARE_PROVIDER_SITE_OTHER): Payer: Self-pay | Admitting: Ophthalmology

## 2023-10-11 MED ORDER — AFLIBERCEPT 2MG/0.05ML IZ SOLN FOR KALEIDOSCOPE
2.0000 mg | INTRAVITREAL | Status: AC | PRN
  Administered 2023-10-11: 2 mg via INTRAVITREAL

## 2023-10-24 NOTE — Progress Notes (Signed)
 [facility] - Clinic Note  11/07/2023   CHIEF COMPLAINT Patient presents for Retina Follow Up  HISTORY OF PRESENT ILLNESS: James Olson is a 73 y.o. male who presents to the clinic today for:  HPI     Retina Follow Up   Patient presents with  Wet AMD.  In left eye.  This started 8 months ago.  Duration of 4 weeks.  Since onset it is stable.  I, the attending physician,  performed the HPI with the patient and updated documentation appropriately.        Comments   Pt denies any changes in vision/no FOL/floaters/pain. Pt is consistent with Dorzolamide  BID OD and Systane Ultra PRN OU.       Last edited by James Rogue, MD on 11/07/2023  7:19 PM.      Referring physician: [name], MD [address] [place],  [place] [address]  HISTORICAL INFORMATION:  Selected notes from the MEDICAL RECORD NUMBER Referred by Dr. Fleeta for concern of new exu ARMD OS LEE:  Ocular Hx- PMH-   CURRENT MEDICATIONS: Current Outpatient Medications (Ophthalmic Drugs)  Medication Sig   dorzolamide  (TRUSOPT ) 2 % ophthalmic solution Place 1 drop into the right eye 2 (two) times daily.   No current facility-administered medications for this visit. (Ophthalmic Drugs)   Current Outpatient Medications (Other)  Medication Sig   albuterol (VENTOLIN HFA) 108 (90 Base) MCG/ACT inhaler Inhale 2 puffs into the lungs every 6 (six) hours as needed for wheezing or shortness of breath (takes after mowing grass).   alendronate (FOSAMAX) 70 MG tablet Take 70 mg by mouth once a week.   allopurinol  (ZYLOPRIM ) 300 MG tablet Take 300 mg by mouth daily.   amLODipine (NORVASC) 5 MG tablet Take 5 mg by mouth daily.   aspirin  EC 81 MG tablet Take 81 mg by mouth daily.   Cholecalciferol (VITAMIN D3 PO) Take 400 Units by mouth 2 (two) times a day.   colchicine  0.6 MG tablet Take 1 tablet (0.6 mg total) by mouth daily. Only take if gout flare up   ezetimibe  (ZETIA ) 10 MG tablet Take  10 mg by mouth daily.   famotidine (PEPCID) 40 MG tablet Take 40 mg by mouth at bedtime.   fluocinonide  cream (LIDEX ) 0.05 % Apply 1 application topically 2 (two) times daily as needed (dry skin).    fluticasone (FLONASE) 50 MCG/ACT nasal spray Place 1 spray into both nostrils as needed for allergies or rhinitis (congestion).   magnesium  oxide (MAG-OX) 400 MG tablet Take 400 mg by mouth 2 (two) times daily.   Magnesium  Oxide 400 (240 Mg) MG TABS Take 1-2 tablets by mouth daily.   omeprazole (PRILOSEC) 40 MG capsule Take 40 mg by mouth every other day.    OVER THE COUNTER MEDICATION Take 1 tablet by mouth daily. Macular Degeneration Supplement   rosuvastatin (CRESTOR) 20 MG tablet Take 20 mg by mouth daily.   telmisartan (MICARDIS) 80 MG tablet Take 80 mg by mouth daily.   No current facility-administered medications for this visit. (Other)   REVIEW OF SYSTEMS: ROS   Positive for: Cardiovascular, Eyes, Allergic/Imm Negative for: Constitutional, Gastrointestinal, Neurological, Skin, Genitourinary, Musculoskeletal, HENT, Endocrine, Respiratory, Psychiatric, Heme/Lymph Last edited by James Olson, COT on 11/07/2023  8:50 AM.      ALLERGIES Allergies  Allergen Reactions   Ace Inhibitors Cough   Aspirin  Other (See Comments)    Can take enteric coated but not the regular. GI  issues   Benicar [Olmesartan Medoxomil] Cough   Other     Preservative in some eye drops. Causes eyes to blister.    PAST MEDICAL HISTORY Past Medical History:  Diagnosis Date   Abnormal stress test    H/O   Atypical chest pain    Diverticulosis    H/O   GERD (gastroesophageal reflux disease)    Hyperlipidemia    Hypertension    Obesity    RBBB    Past Surgical History:  Procedure Laterality Date   CARDIAC CATHETERIZATION  11/26/08   EF 55%  WITH DR. TISA   COLONOSCOPY  03/01/07   W/POLYPECTOMY AND BX   GALLBLADDER SURGERY     PERCUTANEOUS PINNING PHALANX FRACTURE OF HAND     FAMILY  HISTORY Family History  Problem Relation Age of Onset   Arrhythmia Mother         HAS PACEMAKER   Atrial fibrillation Mother        PAF   Sudden death Father 7   SOCIAL HISTORY Social History   Tobacco Use   Smoking status: Never   Smokeless tobacco: Never  Vaping Use   Vaping status: Never Used  Substance Use Topics   Alcohol use: No   Drug use: No       OPHTHALMIC EXAM:  Base Eye Exam     Visual Acuity (Snellen - Linear)       Right Left   Dist cc 20/20 -1 20/80 -1         Tonometry (Tonopen, 8:55 AM)       Right Left   Pressure 13 17         Pupils       Pupils Dark Light Shape React APD   Right PERRL 4 3 Round Brisk None   Left PERRL 4 3 Round Brisk None         Visual Fields       Left Right    Full Full         Extraocular Movement       Right Left    Full, Ortho Full, Ortho         Neuro/Psych     Oriented x3: Yes   Mood/Affect: Normal         Dilation     Both eyes: 1.0% Mydriacyl, 2.5% Phenylephrine @ 8:57 AM           Slit Lamp and Fundus Exam     External Exam       Right Left   External Normal Normal         Slit Lamp Exam       Right Left   Lids/Lashes dermatochalasism, mild MGD dermatochalasis, mild MGD   Conjunctiva/Sclera white and quiet white and quiet   Cornea tear film debris, well healed cataract woun tear film debris, well healed cataract woun   Anterior Chamber deep and clear deep and clear   Iris round and dilated round and dilated   Lens PCIOL perfect positoin, open PC PCIOL perfect positoin, open PC   Anterior Vitreous syneresis, PVD syneresis, PVD         Fundus Exam       Right Left   Disc mild pallor, sharp rim, PPA mild pallor, sharp rim, mild PPA   C/D Ratio 0.4 0.4   Macula flat, blunted foveal reflex, drusen, RPE mottling, clumping, and atrophy, no heme or edema blunted foveal reflex, central CNV with +heme --improved,  central cystic changes- improved, drusen, RPE  mottling, clumping, and atrophy, mild subretinal fibrosis centrally.   Vessels attenuated and tortuous attenuated and mild tortuosity   Periphery attached, reticular degenration, peripheral drusen, pavingstone degeneration, no heme attached, no heme, reticular degeneration, scattered drusen, scattered pavingstone degeneration           Refraction     Wearing Rx       Sphere Cylinder Axis Add   Right -1.25 +1.00 015 +2.25   Left -0.25 +1.00 177 +2.25           IMAGING AND PROCEDURES  Imaging and Procedures for 11/07/2023  OCT, Retina - OU - Both Eyes       Right Eye Quality was good. Central Foveal Thickness: 336. Progression has been stable. Findings include normal foveal contour, no IRF, no SRF, retinal drusen , epiretinal membrane, macular pucker, pigment epithelial detachment, outer retinal atrophy (Diffuse drusen, early GA; mild ERM with pucker).   Left Eye Quality was good. Central Foveal Thickness: 324. Progression has been stable. Findings include abnormal foveal contour, subretinal hyper-reflective material, intraretinal fluid, subretinal fluid (Central CNV with stable improvement in SRF and persistent central SRHM and cystic changes overlying).   Notes *Images captured and stored on drive  Diagnosis / Impression:  OD: non-exudative ARMD with diffuse drusen, early GA; mild ERM with pucker OS: exudative ARMD - Central CNV with stable improvement in SRF and persistent central SRHM and cystic changes overlying  Clinical management:  See below  Abbreviations: NFP - Normal foveal profile. CME - cystoid macular edema. PED - pigment epithelial detachment. IRF - intraretinal fluid. SRF - subretinal fluid. EZ - ellipsoid zone. ERM - epiretinal membrane. ORA - outer retinal atrophy. ORT - outer retinal tubulation. SRHM - subretinal hyper-reflective material. IRHM - intraretinal hyper-reflective material      Intravitreal Injection, Pharmacologic Agent - OS - Left Eye        Time Out 11/07/2023. 9:35 AM. Confirmed correct patient, procedure, site, and patient consented.   Anesthesia Topical anesthesia was used. Anesthetic medications included Lidocaine 2%, Proparacaine 0.5%.   Procedure Preparation included 5% betadine to ocular surface, eyelid speculum. A (32g) needle was used.   Injection: 2 mg aflibercept  2 MG/0.05ML   Route: Intravitreal, Site: Left Eye   NDC: [id number], Lot: [id number], Expiration date: 12/02/2024, Waste: 0 mL   Post-op Post injection exam found visual acuity of at least counting fingers. The patient tolerated the procedure well. There were no complications. The patient received written and verbal post procedure care education.           ASSESSMENT/PLAN:   ICD-10-CM   1. Exudative age-related macular degeneration of left eye with active choroidal neovascularization (HCC)  H35.3221 OCT, Retina - OU - Both Eyes    Intravitreal Injection, Pharmacologic Agent - OS - Left Eye    aflibercept  (EYLEA ) SOLN 2 mg    2. Advanced atrophic nonexudative age-related macular degeneration of right eye without subfoveal involvement  H35.3113     3. Essential hypertension  I10     4. Hypertensive retinopathy of both eyes  H35.033     5. Pseudophakia of both eyes  Z96.1      Exudative age related macular degeneration OS - s/p IVA OS #1 (03.14.2025), #2 (04.11.25), #3 (05.13.25), #4 (06.17.25), #5 (07.15.25) -- IVA resistance ==========================  - s/p IVE OS #1 (08.12.25),#2 (09.09.25), #3 (10.07.25)  - BCVA OS 20/80 from 20/100 - OCT shows Central CNV with stable improvement in [facility]  and persistent central SRHM and cystic changes overlying at 4 weeks. - okay for all treatments / meds (Medicare and BCFed)  - recommend IVE OS #4 (11.04.25) today w/ f/u in 4 wks - pt wishes to be treated with IVA - RBA of procedure discussed, questions answered - IVE informed consent obtained and signed 08.12.25 - IVA informed consent  obtained and signed 03.14.25 - see procedure note  - f/u in 4 wks -- DFE/OCT, possible injection  2. Age related macular degeneration, non-exudative, OD  - advanced stage with early GA (noncentral)   - BCVA OD 20/20 today. - The incidence, anatomy, and pathology of dry AMD, risk of progression, and the AREDS and AREDS 2 study including smoking risks discussed with patient.  - Recommend amsler grid monitoring  - f/u 4 weeks  3.4. Hypertensive retinopathy OU - discussed importance of tight BP control - monitor  5. Psuedophakia OU  - beautiful surgeries   - doing well  Ophthalmic Meds Ordered this visit:  Meds ordered this encounter  Medications   aflibercept  (EYLEA ) SOLN 2 mg     Return in about 4 weeks (around 12/05/2023) for exu ARMD OS, DFE, OCT, Possible Injxn.  There are no Patient Instructions on file for this visit.  This document serves as a record of services personally performed by Redell JUDITHANN Hans, MD, PhD. It was created on their behalf by Wanda GEANNIE Keens, COT an ophthalmic technician. The creation of this record is the provider's dictation and/or activities during the visit.    Electronically signed by:  Wanda GEANNIE Keens, COT  11/07/23 7:21 PM  This document serves as a record of services personally performed by Redell JUDITHANN Hans, MD, PhD. It was created on their behalf by Almetta Pesa, an ophthalmic technician. The creation of this record is the provider's dictation and/or activities during the visit.    Electronically signed by: Almetta Pesa, OA, 11/07/23  7:21 PM   Redell JUDITHANN Hans, M.D., Ph.D. Diseases & Surgery of the Retina and Vitreous [facility]   I have reviewed the above documentation for accuracy and completeness, and I agree with the above. Redell JUDITHANN Hans, M.D., Ph.D. 11/07/23 7:22 PM   Abbreviations: M myopia (nearsighted); A astigmatism; H hyperopia (farsighted); P presbyopia; Mrx spectacle prescription;  CTL  contact lenses; OD right eye; OS left eye; OU both eyes  XT exotropia; ET esotropia; PEK punctate epithelial keratitis; PEE punctate epithelial erosions; DES dry eye syndrome; MGD meibomian gland dysfunction; ATs artificial tears; PFAT's preservative free artificial tears; NSC nuclear sclerotic cataract; PSC posterior subcapsular cataract; ERM epi-retinal membrane; PVD posterior vitreous detachment; RD retinal detachment; DM diabetes mellitus; DR diabetic retinopathy; NPDR non-proliferative diabetic retinopathy; PDR proliferative diabetic retinopathy; CSME clinically significant macular edema; DME diabetic macular edema; dbh dot blot hemorrhages; CWS cotton wool spot; POAG primary open angle glaucoma; C/D cup-to-disc ratio; HVF humphrey visual field; GVF goldmann visual field; OCT optical coherence tomography; IOP intraocular pressure; BRVO Branch retinal vein occlusion; CRVO central retinal vein occlusion; CRAO central retinal artery occlusion; BRAO branch retinal artery occlusion; RT retinal tear; SB scleral buckle; PPV pars plana vitrectomy; VH Vitreous hemorrhage; PRP panretinal laser photocoagulation; IVK intravitreal kenalog; VMT vitreomacular traction; MH Macular hole;  NVD neovascularization of the disc; NVE neovascularization elsewhere; AREDS age related eye disease study; ARMD age related macular degeneration; POAG primary open angle glaucoma; EBMD epithelial/anterior basement membrane dystrophy; ACIOL anterior chamber intraocular lens; IOL intraocular lens; PCIOL posterior chamber intraocular lens; Phaco/IOL  phacoemulsification with intraocular lens placement; PRK photorefractive keratectomy; LASIK laser assisted in situ keratomileusis; HTN hypertension; DM diabetes mellitus; COPD chronic obstructive pulmonary disease

## 2023-11-07 ENCOUNTER — Encounter (INDEPENDENT_AMBULATORY_CARE_PROVIDER_SITE_OTHER): Payer: Self-pay | Admitting: Ophthalmology

## 2023-11-07 ENCOUNTER — Ambulatory Visit (INDEPENDENT_AMBULATORY_CARE_PROVIDER_SITE_OTHER): Admitting: Ophthalmology

## 2023-11-07 DIAGNOSIS — H35033 Hypertensive retinopathy, bilateral: Secondary | ICD-10-CM

## 2023-11-07 DIAGNOSIS — Z961 Presence of intraocular lens: Secondary | ICD-10-CM

## 2023-11-07 DIAGNOSIS — H353113 Nonexudative age-related macular degeneration, right eye, advanced atrophic without subfoveal involvement: Secondary | ICD-10-CM | POA: Diagnosis not present

## 2023-11-07 DIAGNOSIS — H353221 Exudative age-related macular degeneration, left eye, with active choroidal neovascularization: Secondary | ICD-10-CM

## 2023-11-07 DIAGNOSIS — I10 Essential (primary) hypertension: Secondary | ICD-10-CM

## 2023-11-07 MED ORDER — AFLIBERCEPT 2MG/0.05ML IZ SOLN FOR KALEIDOSCOPE
2.0000 mg | INTRAVITREAL | Status: AC | PRN
  Administered 2023-11-07: 2 mg via INTRAVITREAL

## 2023-11-23 NOTE — Progress Notes (Signed)
 [facility] - Clinic Note  12/06/2023   CHIEF COMPLAINT Patient presents for Retina Follow Up  HISTORY OF PRESENT ILLNESS: James Olson is a 73 y.o. male who presents to the clinic today for:  HPI     Retina Follow Up   Patient presents with  Wet AMD.  In both eyes.  This started 4 weeks ago.  Duration of 4 weeks.  Since onset it is stable.        Comments   4  week retina follow up ARMD OS pt is reporting no vision changes noticed he denies any flashes or floaters       Last edited by Resa Delon ORN, COT on 12/06/2023  9:22 AM.     Patient feels the vision is about the same. He states the skim could be thinning out.   Referring physician: [name], MD [address] [place],  [place] [address]  HISTORICAL INFORMATION:  Selected notes from the MEDICAL RECORD NUMBER Referred by Dr. Fleeta for concern of new exu ARMD OS LEE:  Ocular Hx- PMH-   CURRENT MEDICATIONS: Current Outpatient Medications (Ophthalmic Drugs)  Medication Sig   dorzolamide  (TRUSOPT ) 2 % ophthalmic solution Place 1 drop into the right eye 2 (two) times daily.   No current facility-administered medications for this visit. (Ophthalmic Drugs)   Current Outpatient Medications (Other)  Medication Sig   albuterol (VENTOLIN HFA) 108 (90 Base) MCG/ACT inhaler Inhale 2 puffs into the lungs every 6 (six) hours as needed for wheezing or shortness of breath (takes after mowing grass).   alendronate (FOSAMAX) 70 MG tablet Take 70 mg by mouth once a week.   allopurinol  (ZYLOPRIM ) 300 MG tablet Take 300 mg by mouth daily.   amLODipine (NORVASC) 5 MG tablet Take 5 mg by mouth daily.   aspirin  EC 81 MG tablet Take 81 mg by mouth daily.   Cholecalciferol (VITAMIN D3 PO) Take 400 Units by mouth 2 (two) times a day.   colchicine  0.6 MG tablet Take 1 tablet (0.6 mg total) by mouth daily. Only take if gout flare up   ezetimibe  (ZETIA ) 10 MG tablet Take 10 mg by mouth daily.    famotidine (PEPCID) 40 MG tablet Take 40 mg by mouth at bedtime.   fluocinonide  cream (LIDEX ) 0.05 % Apply 1 application topically 2 (two) times daily as needed (dry skin).    fluticasone (FLONASE) 50 MCG/ACT nasal spray Place 1 spray into both nostrils as needed for allergies or rhinitis (congestion).   magnesium  oxide (MAG-OX) 400 MG tablet Take 400 mg by mouth 2 (two) times daily.   Magnesium  Oxide 400 (240 Mg) MG TABS Take 1-2 tablets by mouth daily.   omeprazole (PRILOSEC) 40 MG capsule Take 40 mg by mouth every other day.    OVER THE COUNTER MEDICATION Take 1 tablet by mouth daily. Macular Degeneration Supplement   rosuvastatin (CRESTOR) 20 MG tablet Take 20 mg by mouth daily.   telmisartan (MICARDIS) 80 MG tablet Take 80 mg by mouth daily.   No current facility-administered medications for this visit. (Other)   REVIEW OF SYSTEMS: ROS   Positive for: Cardiovascular, Eyes, Allergic/Imm Negative for: Constitutional, Gastrointestinal, Neurological, Skin, Genitourinary, Musculoskeletal, HENT, Endocrine, Respiratory, Psychiatric, Heme/Lymph Last edited by Resa Delon ORN, COT on 12/06/2023  9:22 AM.       ALLERGIES Allergies  Allergen Reactions   Ace Inhibitors Cough   Aspirin  Other (See Comments)    Can take enteric coated  but not the regular. GI issues   Benicar [Olmesartan Medoxomil] Cough   Other     Preservative in some eye drops. Causes eyes to blister.    PAST MEDICAL HISTORY Past Medical History:  Diagnosis Date   Abnormal stress test    H/O   Atypical chest pain    Diverticulosis    H/O   GERD (gastroesophageal reflux disease)    Hyperlipidemia    Hypertension    Obesity    RBBB    Past Surgical History:  Procedure Laterality Date   CARDIAC CATHETERIZATION  11/26/08   EF 55%  WITH DR. TISA   COLONOSCOPY  03/01/07   W/POLYPECTOMY AND BX   GALLBLADDER SURGERY     PERCUTANEOUS PINNING PHALANX FRACTURE OF HAND     FAMILY HISTORY Family History   Problem Relation Age of Onset   Arrhythmia Mother         HAS PACEMAKER   Atrial fibrillation Mother        PAF   Sudden death Father 56   SOCIAL HISTORY Social History   Tobacco Use   Smoking status: Never   Smokeless tobacco: Never  Vaping Use   Vaping status: Never Used  Substance Use Topics   Alcohol use: No   Drug use: No       OPHTHALMIC EXAM:  Base Eye Exam     Visual Acuity (Snellen - Linear)       Right Left   Dist cc 20/25 -1 20/100 +1   Dist ph cc NI NI         Tonometry (Tonopen, 9:29 AM)       Right Left   Pressure 16 15         Pupils       Pupils Dark Light Shape React APD   Right PERRL 4 3 Round Brisk None   Left PERRL 4 3 Round Brisk None         Visual Fields       Left Right    Full Full         Extraocular Movement       Right Left    Full, Ortho Full, Ortho         Neuro/Psych     Oriented x3: Yes   Mood/Affect: Normal         Dilation     Both eyes: 2.5% Phenylephrine @ 9:29 AM           Slit Lamp and Fundus Exam     External Exam       Right Left   External Normal Normal         Slit Lamp Exam       Right Left   Lids/Lashes dermatochalasism, mild MGD dermatochalasis, mild MGD   Conjunctiva/Sclera white and quiet white and quiet   Cornea tear film debris, well healed cataract woun tear film debris, well healed cataract woun   Anterior Chamber deep and clear deep and clear   Iris round and dilated round and dilated   Lens PCIOL perfect positoin, open PC PCIOL perfect positoin, open PC   Anterior Vitreous syneresis, PVD syneresis, PVD         Fundus Exam       Right Left   Disc mild pallor, sharp rim, PPA mild pallor, sharp rim, mild PPA   C/D Ratio 0.4 0.4   Macula flat, blunted foveal reflex, drusen, RPE mottling, clumping, and atrophy, no heme  or edema blunted foveal reflex, central CNV with +heme --improved, central cystic changes- improved, drusen, RPE mottling, clumping, and  atrophy, mild subretinal fibrosis centrally.   Vessels attenuated and tortuous attenuated and mild tortuosity   Periphery attached, reticular degenration, peripheral drusen, pavingstone degeneration, no heme attached, no heme, reticular degeneration, scattered drusen, scattered pavingstone degeneration           Refraction     Wearing Rx       Sphere Cylinder Axis Add   Right -1.25 +1.00 015 +2.25   Left -0.25 +1.00 177 +2.25           IMAGING AND PROCEDURES  Imaging and Procedures for 12/06/2023         ASSESSMENT/PLAN:   ICD-10-CM   1. Exudative age-related macular degeneration of left eye with active choroidal neovascularization (HCC)  H35.3221 OCT, Retina - OU - Both Eyes    2. Advanced atrophic nonexudative age-related macular degeneration of right eye without subfoveal involvement  H35.3113     3. Essential hypertension  I10     4. Hypertensive retinopathy of both eyes  H35.033     5. Pseudophakia of both eyes  Z96.1      Exudative age related macular degeneration OS - s/p IVA OS #1 (03.14.2025), #2 (04.11.25), #3 (05.13.25), #4 (06.17.25), #5 (07.15.25)  -- IVA resistance ================ - s/p IVE OS #1 (08.12.25),#2 (09.09.25), #3 (10.07.25), #4 (11.04.25)  - BCVA OS 20/100 from 20/80 - OCT shows Central CNV with stable improvement in SRF and persistent central SRHM and cystic changes overlying at 4 weeks. - okay for all treatments / meds (Medicare and BCFed)  - recommend IVE OS #5 (12.03.25) today w/ f/u in 5-6 wks - pt wishes to be treated with IVE - RBA of procedure discussed, questions answered - IVE informed consent obtained and signed 08.12.25 - IVA informed consent obtained and signed 03.14.25 - see procedure note  - f/u in 5-6 wks -- DFE/OCT, possible injection  2. Age related macular degeneration, non-exudative, OD  - advanced stage with early GA (noncentral)   - BCVA OD 20/25 from 20/20 today. - The incidence, anatomy, and pathology of  dry AMD, risk of progression, and the AREDS and AREDS 2 study including smoking risks discussed with patient.  - Recommend amsler grid monitoring  - f/u 5-6 weeks  3.4. Hypertensive retinopathy OU - discussed importance of tight BP control - monitor  5. Psuedophakia OU  - beautiful surgeries   - doing well  Ophthalmic Meds Ordered this visit:  No orders of the defined types were placed in this encounter.    Return in about 4 weeks (around 01/03/2024) for f/u, Ex. AMD, DFE, OCT, Possible, IVE, OS.  There are no Patient Instructions on file for this visit.  This document serves as a record of services personally performed by Redell JUDITHANN Hans, MD, PhD. It was created on their behalf by Almetta Pesa, an ophthalmic technician. The creation of this record is the provider's dictation and/or activities during the visit.    Electronically signed by: Almetta Pesa, OA, 12/06/23  10:24 AM  This document serves as a record of services personally performed by Redell JUDITHANN Hans, MD, PhD. It was created on their behalf by Wanda GEANNIE Keens, COT an ophthalmic technician. The creation of this record is the provider's dictation and/or activities during the visit.    Electronically signed by:  Wanda GEANNIE Keens, COT  12/06/23 10:24 AM  Redell JUDITHANN Hans, M.D., Ph.D. Diseases &  Surgery of the Retina and Vitreous [facility]    Abbreviations: M myopia (nearsighted); A astigmatism; H hyperopia (farsighted); P presbyopia; Mrx spectacle prescription;  CTL contact lenses; OD right eye; OS left eye; OU both eyes  XT exotropia; ET esotropia; PEK punctate epithelial keratitis; PEE punctate epithelial erosions; DES dry eye syndrome; MGD meibomian gland dysfunction; ATs artificial tears; PFAT's preservative free artificial tears; NSC nuclear sclerotic cataract; PSC posterior subcapsular cataract; ERM epi-retinal membrane; PVD posterior vitreous detachment; RD retinal detachment; DM  diabetes mellitus; DR diabetic retinopathy; NPDR non-proliferative diabetic retinopathy; PDR proliferative diabetic retinopathy; CSME clinically significant macular edema; DME diabetic macular edema; dbh dot blot hemorrhages; CWS cotton wool spot; POAG primary open angle glaucoma; C/D cup-to-disc ratio; HVF humphrey visual field; GVF goldmann visual field; OCT optical coherence tomography; IOP intraocular pressure; BRVO Branch retinal vein occlusion; CRVO central retinal vein occlusion; CRAO central retinal artery occlusion; BRAO branch retinal artery occlusion; RT retinal tear; SB scleral buckle; PPV pars plana vitrectomy; VH Vitreous hemorrhage; PRP panretinal laser photocoagulation; IVK intravitreal kenalog; VMT vitreomacular traction; MH Macular hole;  NVD neovascularization of the disc; NVE neovascularization elsewhere; AREDS age related eye disease study; ARMD age related macular degeneration; POAG primary open angle glaucoma; EBMD epithelial/anterior basement membrane dystrophy; ACIOL anterior chamber intraocular lens; IOL intraocular lens; PCIOL posterior chamber intraocular lens; Phaco/IOL phacoemulsification with intraocular lens placement; PRK photorefractive keratectomy; LASIK laser assisted in situ keratomileusis; HTN hypertension; DM diabetes mellitus; COPD chronic obstructive pulmonary disease

## 2023-12-06 ENCOUNTER — Encounter (INDEPENDENT_AMBULATORY_CARE_PROVIDER_SITE_OTHER): Payer: Self-pay | Admitting: Ophthalmology

## 2023-12-06 ENCOUNTER — Ambulatory Visit (INDEPENDENT_AMBULATORY_CARE_PROVIDER_SITE_OTHER): Admitting: Ophthalmology

## 2023-12-06 DIAGNOSIS — H353221 Exudative age-related macular degeneration, left eye, with active choroidal neovascularization: Secondary | ICD-10-CM | POA: Diagnosis not present

## 2023-12-06 DIAGNOSIS — H353113 Nonexudative age-related macular degeneration, right eye, advanced atrophic without subfoveal involvement: Secondary | ICD-10-CM | POA: Diagnosis not present

## 2023-12-06 DIAGNOSIS — H35033 Hypertensive retinopathy, bilateral: Secondary | ICD-10-CM | POA: Diagnosis not present

## 2023-12-06 DIAGNOSIS — I10 Essential (primary) hypertension: Secondary | ICD-10-CM

## 2023-12-06 DIAGNOSIS — Z961 Presence of intraocular lens: Secondary | ICD-10-CM

## 2023-12-06 MED ORDER — AFLIBERCEPT 2MG/0.05ML IZ SOLN FOR KALEIDOSCOPE
2.0000 mg | INTRAVITREAL | Status: AC | PRN
  Administered 2023-12-06: 2 mg via INTRAVITREAL

## 2024-01-05 NOTE — Progress Notes (Signed)
 " [facility] - Clinic Note  01/16/2024   CHIEF COMPLAINT Patient presents for Retina Follow Up  HISTORY OF PRESENT ILLNESS: James Olson is a 74 y.o. male who presents to the clinic today for:  HPI     Retina Follow Up   Patient presents with  Wet AMD.  In both eyes.  This started 6 weeks ago.  Duration of 6 weeks.  Since onset it is stable.  I, the attending physician,  performed the HPI with the patient and updated documentation appropriately.        Comments   6 week retina follow up ARMD OS. Pt denies changes in [place]. Pt denies FOL/floaters.  Pt consistent with Dorzolamide  OD BID.  Pt uses Systane OU PRN.      Last edited by [name], MD on 01/16/2024  6:18 PM.      Patient states he does feel VA has improved just struggles w/ focusing.   Referring physician: [name], MD [address] [place],  [place] [address]  HISTORICAL INFORMATION:  Selected notes from the MEDICAL RECORD NUMBER Referred by Dr. Fleeta for concern of new exu ARMD OS LEE:  Ocular Hx- PMH-   CURRENT MEDICATIONS: Current Outpatient Medications (Ophthalmic Drugs)  Medication Sig   dorzolamide  (TRUSOPT ) 2 % ophthalmic solution Place 1 drop into the right eye 2 (two) times daily.   No current facility-administered medications for this visit. (Ophthalmic Drugs)   Current Outpatient Medications (Other)  Medication Sig   albuterol (VENTOLIN HFA) 108 (90 Base) MCG/ACT inhaler Inhale 2 puffs into the lungs every 6 (six) hours as needed for wheezing or shortness of breath (takes after mowing grass).   alendronate (FOSAMAX) 70 MG tablet Take 70 mg by mouth once a week.   allopurinol  (ZYLOPRIM ) 300 MG tablet Take 300 mg by mouth daily.   amLODipine (NORVASC) 5 MG tablet Take 5 mg by mouth daily.   aspirin  EC 81 MG tablet Take 81 mg by mouth daily.   Cholecalciferol (VITAMIN D3 PO) Take 400 Units by mouth 2 (two) times a day.   colchicine  0.6 MG tablet Take 1  tablet (0.6 mg total) by mouth daily. Only take if gout flare up   ezetimibe  (ZETIA ) 10 MG tablet Take 10 mg by mouth daily.   famotidine (PEPCID) 40 MG tablet Take 40 mg by mouth at bedtime.   fluocinonide  cream (LIDEX ) 0.05 % Apply 1 application topically 2 (two) times daily as needed (dry skin).    fluticasone (FLONASE) 50 MCG/ACT nasal spray Place 1 spray into both nostrils as needed for allergies or rhinitis (congestion).   magnesium  oxide (MAG-OX) 400 MG tablet Take 400 mg by mouth 2 (two) times daily.   Magnesium  Oxide 400 (240 Mg) MG TABS Take 1-2 tablets by mouth daily.   omeprazole (PRILOSEC) 40 MG capsule Take 40 mg by mouth every other day.    OVER THE COUNTER MEDICATION Take 1 tablet by mouth daily. Macular Degeneration Supplement   rosuvastatin (CRESTOR) 20 MG tablet Take 20 mg by mouth daily.   telmisartan (MICARDIS) 80 MG tablet Take 80 mg by mouth daily.   No current facility-administered medications for this visit. (Other)   REVIEW OF SYSTEMS:   ALLERGIES Allergies  Allergen Reactions   Ace Inhibitors Cough   Aspirin  Other (See Comments)    Can take enteric coated but not the regular. GI issues   Benicar [Olmesartan Medoxomil] Cough   Other  Preservative in some eye drops. Causes eyes to blister.    PAST MEDICAL HISTORY Past Medical History:  Diagnosis Date   Abnormal stress test    H/O   Atypical chest pain    Diverticulosis    H/O   GERD (gastroesophageal reflux disease)    Hyperlipidemia    Hypertension    Obesity    RBBB    Past Surgical History:  Procedure Laterality Date   CARDIAC CATHETERIZATION  11/26/08   EF 55%  WITH DR. TISA   COLONOSCOPY  03/01/07   W/POLYPECTOMY AND BX   GALLBLADDER SURGERY     PERCUTANEOUS PINNING PHALANX FRACTURE OF HAND     FAMILY HISTORY Family History  Problem Relation Age of Onset   Arrhythmia Mother         HAS PACEMAKER   Atrial fibrillation Mother        PAF   Sudden death Father 44   SOCIAL  HISTORY Social History   Tobacco Use   Smoking status: Never   Smokeless tobacco: Never  Vaping Use   Vaping status: Never Used  Substance Use Topics   Alcohol use: No   Drug use: No       OPHTHALMIC EXAM:  Base Eye Exam     Visual Acuity (Snellen - Linear)       Right Left   Dist cc 20/20 20/100 +1   Dist ph cc  NI         Tonometry (Tonopen, 8:48 AM)       Right Left   Pressure 15 15         Pupils       Pupils Dark Light Shape React APD   Right PERRL 4 3 Round Brisk None   Left PERRL 4 3 Round Brisk None         Visual Fields       Left Right    Full Full         Extraocular Movement       Right Left    Full, Ortho Full, Ortho         Neuro/Psych     Oriented x3: Yes   Mood/Affect: Normal         Dilation     Both eyes: 1.0% Mydriacyl, 2.5% Phenylephrine @ 8:48 AM           Slit Lamp and Fundus Exam     External Exam       Right Left   External Normal Normal         Slit Lamp Exam       Right Left   Lids/Lashes dermatochalasism, mild MGD dermatochalasis, mild MGD   Conjunctiva/Sclera white and quiet white and quiet   Cornea tear film debris, well healed cataract woun tear film debris, well healed cataract woun   Anterior Chamber deep and clear deep and clear   Iris round and dilated round and dilated   Lens PCIOL perfect positoin, open PC PCIOL perfect positoin, open PC   Anterior Vitreous syneresis, PVD syneresis, PVD         Fundus Exam       Right Left   Disc mild pallor, sharp rim, PPA mild pallor, sharp rim, mild PPA   C/D Ratio 0.4 0.4   Macula flat, blunted foveal reflex, drusen, RPE mottling, clumping, and atrophy, no heme or edema blunted foveal reflex, central CNV with +heme --improved, central cystic changes, drusen, RPE mottling, clumping, and  atrophy, mild subretinal fibrosis centrally.   Vessels attenuated and tortuous attenuated and mild tortuosity   Periphery attached, reticular degenration,  peripheral drusen, pavingstone degeneration, no heme attached, no heme, reticular degeneration, scattered drusen, scattered pavingstone degeneration           Refraction     Wearing Rx       Sphere Cylinder Axis Add   Right -1.25 +1.00 015 +2.25   Left -0.25 +1.00 177 +2.25           IMAGING AND PROCEDURES  Imaging and Procedures for 01/16/2024  OCT, Retina - OU - Both Eyes       Right Eye Quality was good. Central Foveal Thickness: 344. Progression has been stable. Findings include normal foveal contour, no IRF, no SRF, retinal drusen , epiretinal membrane, macular pucker, pigment epithelial detachment, outer retinal atrophy (Diffuse drusen, early GA; mild ERM with pucker).   Left Eye Quality was good. Central Foveal Thickness: 340. Progression has been stable. Findings include abnormal foveal contour, subretinal hyper-reflective material, intraretinal fluid, subretinal fluid (Central CNV with stable improvement in SRF and persistent central SRHM and cystic changes overlying).   Notes *Images captured and stored on drive  Diagnosis / Impression:  OD: non-exudative ARMD with diffuse drusen, early GA; mild ERM with pucker OS: exudative ARMD - Central CNV with stable improvement in SRF and persistent central SRHM and cystic changes overlying  Clinical management:  See below  Abbreviations: NFP - Normal foveal profile. CME - cystoid macular edema. PED - pigment epithelial detachment. IRF - intraretinal fluid. SRF - subretinal fluid. EZ - ellipsoid zone. ERM - epiretinal membrane. ORA - outer retinal atrophy. ORT - outer retinal tubulation. SRHM - subretinal hyper-reflective material. IRHM - intraretinal hyper-reflective material      Intravitreal Injection, Pharmacologic Agent - OS - Left Eye       Time Out 01/16/2024. 9:43 AM. Confirmed correct patient, procedure, site, and patient consented.   Anesthesia Topical anesthesia was used. Anesthetic medications included  Lidocaine 2%, Proparacaine 0.5%.   Procedure Preparation included 5% betadine to ocular surface, eyelid speculum. A (32g) needle was used.   Injection: 2 mg aflibercept  2 MG/0.05ML   Route: Intravitreal, Site: Left Eye   NDC: [id number], Lot: [id number], Expiration date: 02/01/2025, Waste: 0 mL   Post-op Post injection exam found visual acuity of at least counting fingers. The patient tolerated the procedure well. There were no complications. The patient received written and verbal post procedure care education.             ASSESSMENT/PLAN:   ICD-10-CM   1. Exudative age-related macular degeneration of left eye with active choroidal neovascularization (HCC)  H35.3221 OCT, Retina - OU - Both Eyes    Intravitreal Injection, Pharmacologic Agent - OS - Left Eye    aflibercept  (EYLEA ) SOLN 2 mg    2. Advanced atrophic nonexudative age-related macular degeneration of right eye without subfoveal involvement  H35.3113     3. Essential hypertension  I10     4. Hypertensive retinopathy of both eyes  H35.033     5. Pseudophakia of both eyes  Z96.1       Exudative age related macular degeneration OS - s/p IVA OS #1 (03.14.2025), #2 (04.11.25), #3 (05.13.25), #4 (06.17.25), #5 (07.15.25)  -- IVA resistance ================ - s/p IVE OS #1 (08.12.25),#2 (09.09.25), #3 (10.07.25), #4 (11.04.25), #5 (12.03.26)  - BCVA OS 20/100 - stable - OCT shows Central CNV with stable improvement in SRF and persistent  central SRHM and cystic changes overlying at 6 weeks. - okay for all treatments / meds (Medicare and BCFed)  - recommend IVE OS #6 (01.13.26) today w/ f/u in 6 wks  - pt wishes to be treated with IVE - RBA of procedure discussed, questions answered - IVE informed consent obtained and signed 08.12.25 - IVA informed consent obtained and signed 03.14.25 - see procedure note  - f/u in 6 wks -- DFE/OCT, possible injection  2. Age related macular degeneration, non-exudative, OD  -  advanced stage with early GA (noncentral)   - BCVA OD 20/20 from 20/25 today. - The incidence, anatomy, and pathology of dry AMD, risk of progression, and the AREDS and AREDS 2 study including smoking risks discussed with patient.  - Recommend amsler grid monitoring  - f/u 6 weeks  3.4. Hypertensive retinopathy OU - discussed importance of tight BP control - monitor  5. Psuedophakia OU  - beautiful surgeries   - doing well  Ophthalmic Meds Ordered this visit:  Meds ordered this encounter  Medications   aflibercept  (EYLEA ) SOLN 2 mg     Return in about 6 weeks (around 02/27/2024) for exu ARMD OD, DFE, OCT, likely IVE OS.  There are no Patient Instructions on file for this visit.   This document serves as a record of services personally performed by Redell JUDITHANN Hans, MD, PhD. It was created on their behalf by Wanda GEANNIE Keens, COT an ophthalmic technician. The creation of this record is the provider's dictation and/or activities during the visit.    Electronically signed by:  Wanda GEANNIE Keens, COT  01/25/24 3:27 PM  This document serves as a record of services personally performed by Redell JUDITHANN Hans, MD, PhD. It was created on their behalf by Almetta Pesa, an ophthalmic technician. The creation of this record is the provider's dictation and/or activities during the visit.    Electronically signed by: Almetta Pesa, OA, 01/25/24  3:27 PM   Redell JUDITHANN Hans, M.D., Ph.D. Diseases & Surgery of the Retina and Vitreous [facility]  I have reviewed the above documentation for accuracy and completeness, and I agree with the above. Redell JUDITHANN Hans, M.D., Ph.D. 01/25/24 3:27 PM    Abbreviations: M myopia (nearsighted); A astigmatism; H hyperopia (farsighted); P presbyopia; Mrx spectacle prescription;  CTL contact lenses; OD right eye; OS left eye; OU both eyes  XT exotropia; ET esotropia; PEK punctate epithelial keratitis; PEE punctate epithelial  erosions; DES dry eye syndrome; MGD meibomian gland dysfunction; ATs artificial tears; PFAT's preservative free artificial tears; NSC nuclear sclerotic cataract; PSC posterior subcapsular cataract; ERM epi-retinal membrane; PVD posterior vitreous detachment; RD retinal detachment; DM diabetes mellitus; DR diabetic retinopathy; NPDR non-proliferative diabetic retinopathy; PDR proliferative diabetic retinopathy; CSME clinically significant macular edema; DME diabetic macular edema; dbh dot blot hemorrhages; CWS cotton wool spot; POAG primary open angle glaucoma; C/D cup-to-disc ratio; HVF humphrey visual field; GVF goldmann visual field; OCT optical coherence tomography; IOP intraocular pressure; BRVO Branch retinal vein occlusion; CRVO central retinal vein occlusion; CRAO central retinal artery occlusion; BRAO branch retinal artery occlusion; RT retinal tear; SB scleral buckle; PPV pars plana vitrectomy; VH Vitreous hemorrhage; PRP panretinal laser photocoagulation; IVK intravitreal kenalog; VMT vitreomacular traction; MH Macular hole;  NVD neovascularization of the disc; NVE neovascularization elsewhere; AREDS age related eye disease study; ARMD age related macular degeneration; POAG primary open angle glaucoma; EBMD epithelial/anterior basement membrane dystrophy; ACIOL anterior chamber intraocular lens; IOL intraocular lens; PCIOL posterior chamber  intraocular lens; Phaco/IOL phacoemulsification with intraocular lens placement; PRK photorefractive keratectomy; LASIK laser assisted in situ keratomileusis; HTN hypertension; DM diabetes mellitus; COPD chronic obstructive pulmonary disease  "

## 2024-01-16 ENCOUNTER — Encounter (INDEPENDENT_AMBULATORY_CARE_PROVIDER_SITE_OTHER): Payer: Self-pay | Admitting: Ophthalmology

## 2024-01-16 ENCOUNTER — Ambulatory Visit (INDEPENDENT_AMBULATORY_CARE_PROVIDER_SITE_OTHER): Admitting: Ophthalmology

## 2024-01-16 DIAGNOSIS — H353113 Nonexudative age-related macular degeneration, right eye, advanced atrophic without subfoveal involvement: Secondary | ICD-10-CM | POA: Diagnosis not present

## 2024-01-16 DIAGNOSIS — H353221 Exudative age-related macular degeneration, left eye, with active choroidal neovascularization: Secondary | ICD-10-CM

## 2024-01-16 DIAGNOSIS — Z961 Presence of intraocular lens: Secondary | ICD-10-CM | POA: Diagnosis not present

## 2024-01-16 DIAGNOSIS — H35033 Hypertensive retinopathy, bilateral: Secondary | ICD-10-CM | POA: Diagnosis not present

## 2024-01-16 DIAGNOSIS — I10 Essential (primary) hypertension: Secondary | ICD-10-CM | POA: Diagnosis not present

## 2024-01-16 MED ORDER — AFLIBERCEPT 2MG/0.05ML IZ SOLN FOR KALEIDOSCOPE
2.0000 mg | INTRAVITREAL | Status: AC | PRN
  Administered 2024-01-16: 2 mg via INTRAVITREAL

## 2024-02-06 ENCOUNTER — Other Ambulatory Visit (INDEPENDENT_AMBULATORY_CARE_PROVIDER_SITE_OTHER): Payer: Self-pay

## 2024-02-27 ENCOUNTER — Encounter (INDEPENDENT_AMBULATORY_CARE_PROVIDER_SITE_OTHER): Admitting: Ophthalmology
